# Patient Record
Sex: Male | Born: 1965
Health system: Southern US, Community
[De-identification: ages and names within clinical notes are randomized; demographics above are authoritative.]

## PROBLEM LIST (undated history)

## (undated) DIAGNOSIS — C801 Malignant (primary) neoplasm, unspecified: Secondary | ICD-10-CM

## (undated) DIAGNOSIS — J449 Chronic obstructive pulmonary disease, unspecified: Secondary | ICD-10-CM

## (undated) DIAGNOSIS — I219 Acute myocardial infarction, unspecified: Secondary | ICD-10-CM

## (undated) DIAGNOSIS — I10 Essential (primary) hypertension: Secondary | ICD-10-CM

## (undated) DIAGNOSIS — E785 Hyperlipidemia, unspecified: Secondary | ICD-10-CM

## (undated) HISTORY — PX: OTHER SURGICAL HISTORY: SHX169

## (undated) HISTORY — DX: Chronic obstructive pulmonary disease, unspecified: J44.9

## (undated) HISTORY — DX: Hyperlipidemia, unspecified: E78.5

---
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
## 2002-10-29 ENCOUNTER — Emergency Department (HOSPITAL_COMMUNITY): Admission: EM | Admit: 2002-10-29 | Discharge: 2002-10-29 | Payer: Self-pay | Admitting: Emergency Medicine

## 2006-07-16 ENCOUNTER — Encounter: Payer: Self-pay | Admitting: Emergency Medicine

## 2006-07-17 ENCOUNTER — Inpatient Hospital Stay (HOSPITAL_COMMUNITY): Admission: EM | Admit: 2006-07-17 | Discharge: 2006-07-17 | Payer: Self-pay | Admitting: Emergency Medicine
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
## 2011-05-17 ENCOUNTER — Other Ambulatory Visit: Payer: Self-pay | Admitting: Internal Medicine

## 2011-05-17 DIAGNOSIS — C3490 Malignant neoplasm of unspecified part of unspecified bronchus or lung: Secondary | ICD-10-CM

## 2011-10-04 ENCOUNTER — Other Ambulatory Visit: Payer: Self-pay | Admitting: Physician Assistant

## 2012-05-07 ENCOUNTER — Encounter: Payer: Self-pay | Admitting: Specialist

## 2012-05-07 NOTE — Progress Notes (Signed)
Met on Monday, March 10, with [facility] as he talked about the latest scan results he received.  I listened, offered support.

## 2012-07-10 ENCOUNTER — Telehealth: Payer: Self-pay | Admitting: Radiation Oncology

## 2012-07-10 NOTE — Telephone Encounter (Signed)
Opened in error

## 2013-04-07 ENCOUNTER — Other Ambulatory Visit: Payer: Self-pay | Admitting: Radiation Therapy

## 2019-04-27 ENCOUNTER — Emergency Department (HOSPITAL_COMMUNITY): Payer: Self-pay

## 2019-04-27 ENCOUNTER — Other Ambulatory Visit: Payer: Self-pay

## 2019-04-27 ENCOUNTER — Emergency Department (HOSPITAL_COMMUNITY)
Admission: EM | Admit: 2019-04-27 | Discharge: 2019-04-27 | Discharge status: Against Medical Advice | Payer: Self-pay | Attending: Emergency Medicine | Admitting: Emergency Medicine

## 2019-04-27 ENCOUNTER — Encounter (HOSPITAL_COMMUNITY): Payer: Self-pay | Admitting: Emergency Medicine

## 2019-04-27 DIAGNOSIS — F17210 Nicotine dependence, cigarettes, uncomplicated: Secondary | ICD-10-CM | POA: Insufficient documentation

## 2019-04-27 DIAGNOSIS — I214 Non-ST elevation (NSTEMI) myocardial infarction: Secondary | ICD-10-CM | POA: Insufficient documentation

## 2019-04-27 LAB — CBC WITH DIFFERENTIAL/PLATELET
Abs Immature Granulocytes: 0.02 10*3/uL (ref 0.00–0.07)
Basophils Absolute: 0.1 10*3/uL (ref 0.0–0.1)
Basophils Relative: 1 %
Eosinophils Absolute: 0.1 10*3/uL (ref 0.0–0.5)
Eosinophils Relative: 2 %
HCT: 41 % (ref 39.0–52.0)
Hemoglobin: 13.8 g/dL (ref 13.0–17.0)
Immature Granulocytes: 0 %
Lymphocytes Relative: 36 %
Lymphs Abs: 1.9 10*3/uL (ref 0.7–4.0)
MCH: 33.9 pg (ref 26.0–34.0)
MCHC: 33.7 g/dL (ref 30.0–36.0)
MCV: 100.7 fL — ABNORMAL HIGH (ref 80.0–100.0)
Monocytes Absolute: 0.5 10*3/uL (ref 0.1–1.0)
Monocytes Relative: 9 %
Neutro Abs: 2.8 10*3/uL (ref 1.7–7.7)
Neutrophils Relative %: 52 %
Platelets: 217 10*3/uL (ref 150–400)
RBC: 4.07 MIL/uL — ABNORMAL LOW (ref 4.22–5.81)
RDW: 12.7 % (ref 11.5–15.5)
WBC: 5.4 10*3/uL (ref 4.0–10.5)
nRBC: 0 % (ref 0.0–0.2)

## 2019-04-27 LAB — COMPREHENSIVE METABOLIC PANEL
ALT: 170 U/L — ABNORMAL HIGH (ref 0–44)
AST: 127 U/L — ABNORMAL HIGH (ref 15–41)
Albumin: 3.8 g/dL (ref 3.5–5.0)
Alkaline Phosphatase: 62 U/L (ref 38–126)
Anion gap: 15 (ref 5–15)
BUN: 11 mg/dL (ref 6–20)
CO2: 19 mmol/L — ABNORMAL LOW (ref 22–32)
Calcium: 8.6 mg/dL — ABNORMAL LOW (ref 8.9–10.3)
Chloride: 102 mmol/L (ref 98–111)
Creatinine, Ser: 0.86 mg/dL (ref 0.61–1.24)
GFR calc Af Amer: >60 mL/min (ref >60)
GFR calc non Af Amer: >60 mL/min (ref >60)
Glucose, Bld: 83 mg/dL (ref 70–99)
Potassium: 4.1 mmol/L (ref 3.5–5.1)
Sodium: 136 mmol/L (ref 135–145)
Total Bilirubin: 0.9 mg/dL (ref 0.3–1.2)
Total Protein: 8 g/dL (ref 6.5–8.1)

## 2019-04-27 LAB — TROPONIN I (HIGH SENSITIVITY): Troponin I (High Sensitivity): 76 ng/L — ABNORMAL HIGH (ref <18)

## 2019-04-27 LAB — RAPID URINE DRUG SCREEN, HOSP PERFORMED
Amphetamines: NOT DETECTED
Barbiturates: NOT DETECTED
Benzodiazepines: NOT DETECTED
Cocaine: POSITIVE — AB
Opiates: NOT DETECTED
Tetrahydrocannabinol: POSITIVE — AB

## 2019-04-27 LAB — D-DIMER, QUANTITATIVE: D-Dimer, Quant: >20 ug/mL-FEU — ABNORMAL HIGH (ref 0.00–0.50)

## 2019-04-27 LAB — ETHANOL: Alcohol, Ethyl (B): 222 mg/dL — ABNORMAL HIGH (ref <10)

## 2019-04-27 MED ORDER — ASPIRIN 81 MG PO CHEW
324.0000 mg | CHEWABLE_TABLET | Freq: Once | ORAL | Status: AC
  Administered 2019-04-27: 324 mg via ORAL
  Filled 2019-04-27: qty 4

## 2019-04-27 MED ORDER — NITROGLYCERIN IN D5W 200-5 MCG/ML-% IV SOLN
5.0000 ug/min | INTRAVENOUS | Status: DC
  Administered 2019-04-27: 5 ug/min via INTRAVENOUS
  Filled 2019-04-27: qty 250

## 2019-04-27 MED ORDER — SODIUM CHLORIDE 0.9 % IV SOLN: INTRAVENOUS | Status: DC

## 2019-04-27 NOTE — Discharge Instructions (Signed)
You and I have discussed your results and I have informed you that you are having a heart attack.  This could be from a blockage in your heart but could also be related to a blood clot.  Either way this could end with you dying.  You have refused to stay to continue treatment in the emergency department for life saving care.  At any time should you change your mind you may return to the emergency department immediately and we will take care of you.  Be aware that by going home you are putting your life at risk and you may die from this illness

## 2019-04-27 NOTE — ED Provider Notes (Signed)
[facility] EMERGENCY DEPARTMENT Provider Note   CSN: [id number] Arrival date & time: 04/27/19  2043     History Chief Complaint  Patient presents with  . Chest Pain    Luis Gilbert is a 54 y.o. male.  HPI   This patient is a 54 year old male, he presents to the hospital with a complaint of chest pain which is just left of his sternum.  It has been going on intermittently for the last week since his father died and he was at the funeral.  He reports that multiple family members have cardiac disease including his father, the patient himself states that he takes no medications, he does not go to the doctor, he drinks about 80 ounces of beer per day and considers himself a heavy drinker.  He has been drinking this week, he denies drug use, he does endorse smoking and has smoked over 1 pack of cigarettes a day for over 35 years.  He reports that the pain in his chest seems to radiate to his shoulder and down his arm to the elbow.  He becomes short of breath with even small amounts of exertion and states walking across the room causes him to become short of breath.  He denies swelling of the legs and denies fevers chills nausea vomiting or diarrhea.  The symptoms have been persistent and gradually worsening prompting his visit to the emergency department tonight.  I have reviewed the patient's medical history, I see no prior visits to the emergency department since 2008.  There is a history of a possible malignant neoplasm of the lungs from oncology visits in 2013.     History reviewed. No pertinent past medical history.  There are no problems to display for this patient.   History reviewed. No pertinent surgical history.     History reviewed. No pertinent family history.  Social History   Tobacco Use  . Smoking status: Current Every Day Smoker    Packs/day: 1.00    Types: Cigarettes  . Smokeless tobacco: Never Used  Substance Use Topics  . Alcohol use: Yes    Comment: 2 40s  per week  . Drug use: Yes    Frequency: 7.0 times per week    Types: Marijuana    Comment: daily    Home Medications Prior to Admission medications   Not on File    Allergies    Patient has no known allergies.  Review of Systems   Review of Systems  All other systems reviewed and are negative.   Physical Exam Updated Vital Signs BP 118/85   Pulse (!) 112   Temp 98.4 F (36.9 C)   Resp (!) 23   Ht 1.829 m (6')   Wt 88.5 kg   SpO2 98%   BMI 26.45 kg/m   Physical Exam Vitals and nursing note reviewed.  Constitutional:      General: He is not in acute distress.    Appearance: He is well-developed.  HENT:     Head: Normocephalic and atraumatic.     Mouth/Throat:     Pharynx: No oropharyngeal exudate.  Eyes:     General: No scleral icterus.       Right eye: No discharge.        Left eye: No discharge.     Conjunctiva/sclera: Conjunctivae normal.     Pupils: Pupils are equal, round, and reactive to light.  Neck:     Thyroid: No thyromegaly.     Vascular: No JVD.  Cardiovascular:     Rate and Rhythm: Regular rhythm. Tachycardia present.     Heart sounds: Normal heart sounds. No murmur. No friction rub. No gallop.   Pulmonary:     Effort: Pulmonary effort is normal. No respiratory distress.     Breath sounds: Normal breath sounds. No wheezing or rales.  Abdominal:     General: Bowel sounds are normal. There is no distension.     Palpations: Abdomen is soft. There is no mass.     Tenderness: There is no abdominal tenderness.  Musculoskeletal:        General: No tenderness. Normal range of motion.     Cervical back: Normal range of motion and neck supple.  Lymphadenopathy:     Cervical: No cervical adenopathy.  Skin:    General: Skin is warm and dry.     Findings: No erythema or rash.  Neurological:     Mental Status: He is alert.     Coordination: Coordination normal.  Psychiatric:        Behavior: Behavior normal.     ED Results / Procedures /  Treatments   Labs (all labs ordered are listed, but only abnormal results are displayed) Labs Reviewed  CBC WITH DIFFERENTIAL/PLATELET - Abnormal; Notable for the following components:      Result Value   RBC 4.07 (*)    MCV 100.7 (*)    All other components within normal limits  COMPREHENSIVE METABOLIC PANEL - Abnormal; Notable for the following components:   CO2 19 (*)    Calcium 8.6 (*)    AST 127 (*)    ALT 170 (*)    All other components within normal limits  ETHANOL - Abnormal; Notable for the following components:   Alcohol, Ethyl (B) 222 (*)    All other components within normal limits  TROPONIN I (HIGH SENSITIVITY) - Abnormal; Notable for the following components:   Troponin I (High Sensitivity) 76 (*)    All other components within normal limits  RAPID URINE DRUG SCREEN, HOSP PERFORMED  D-DIMER, QUANTITATIVE (NOT AT [facility])  TROPONIN I (HIGH SENSITIVITY)    EKG EKG Interpretation  Date/Time:  Sunday April 27 2019 21:04:44 EST Ventricular Rate:  110 PR Interval:    QRS Duration: 86 QT Interval:  334 QTC Calculation: 452 R Axis:   -36 Text Interpretation: Sinus tachycardia Left axis deviation Abnormal T, consider ischemia, diffuse leads No old tracing to compare Confirmed by Noemi Chapel [phone]) on 04/27/2019 9:10:11 PM   Radiology DG Chest Port 1 View  Result Date: 04/27/2019 CLINICAL DATA:  Chest pain EXAM: PORTABLE CHEST 1 VIEW COMPARISON:  Jul 17, 2006 FINDINGS: The heart size and mediastinal contours are borderline enlarged. There is prominence of the central pulmonary vasculature. No large airspace consolidation or pleural effusion. Right posterior healed rib fractures are seen. IMPRESSION: Prominence of the central pulmonary vasculature. Electronically Signed   By: Prudencio Pair M.D.   On: 04/27/2019 22:02    Procedures .Critical Care Performed by: Noemi Chapel, MD Authorized by: Noemi Chapel, MD   Critical care provider statement:    Critical care  time (minutes):  35   Critical care time was exclusive of:  Separately billable procedures and treating other patients and teaching time   Critical care was necessary to treat or prevent imminent or life-threatening deterioration of the following conditions:  Cardiac failure   Critical care was time spent personally by me on the following activities:  Blood draw for specimens, development  of treatment plan with patient or surrogate, discussions with consultants, evaluation of patient's response to treatment, examination of patient, obtaining history from patient or surrogate, ordering and performing treatments and interventions, ordering and review of laboratory studies, ordering and review of radiographic studies, pulse oximetry, re-evaluation of patient's condition and review of old charts   (including critical care time)  Medications Ordered in ED Medications  nitroGLYCERIN 50 mg in dextrose 5 % 250 mL (0.2 mg/mL) infusion (0 mcg/min Intravenous Stopped 04/27/19 2238)  0.9 %  sodium chloride infusion ( Intravenous Stopped 04/27/19 2238)  aspirin chewable tablet 324 mg (324 mg Oral Given 04/27/19 2151)    ED Course  I have reviewed the triage vital signs and the nursing notes.  Pertinent labs & imaging results that were available during my care of the patient were reviewed by me and considered in my medical decision making (see chart for details).    MDM Rules/Calculators/A&P                      The patient is tachycardic to 110 bpm.  The EKG is abnormal with left axis deviation and ischemic findings with ST abnormalities and T wave inversions specifically in the inferior leads II, III and aVF.  Additionally there are some lateral precordial abnormalities in V4 V5 and V6 with T wave abnormalities.  Evidently this patient has some type of history of cancer thus pulmonary embolism should be considered, given his shortness of breath and chest pain this is a reasonable consideration.  Labs  pending, will add a D-dimer as well as a troponin, aspirin will be given  I have reviewed this patient's labs, he has an elevated troponin, a chest x-ray that shows prominence of his pulmonary vessels and an EKG which is ischemic.  This could be pulmonary embolism, this could also be related to a heart attack, I have discussed these findings with the patient at the bedside and he absolutely refuses to stay in the hospital.  Despite his alcohol level of 222 he is wide awake alert and able to very clearly tell me that he understands that if he leaves he may very well die.  He will not tell me the reason that he wants to leave.  He agrees to sign out [facility].  He continues to be mildly tachycardic, he has medical decision-making capacity and states that he will come back if he changes his mind or if his symptoms worsen.  He is aware that this is a life-threatening condition that could end with the loss of his life.  He is very clearly able to articulate to me the consequences of leaving [place].  I spent approximately 10 to 15 minutes trying to counsel the patient to stay, he refuses  Final Clinical Impression(s) / ED Diagnoses Final diagnoses:  NSTEMI (non-ST elevated myocardial infarction) [facility])    Rx / [facility] Orders ED Discharge Orders    None       Noemi Chapel, MD 04/27/19 2241

## 2019-04-27 NOTE — ED Triage Notes (Signed)
Pt c/o of chest pain x1 week. Pain began after his father died. Has not taken anything for the pain. No relieving or aggravating factors

## 2019-04-28 ENCOUNTER — Encounter (HOSPITAL_COMMUNITY): Payer: Self-pay

## 2019-04-28 ENCOUNTER — Other Ambulatory Visit: Payer: Self-pay

## 2019-04-28 ENCOUNTER — Emergency Department (HOSPITAL_COMMUNITY): Payer: Self-pay

## 2019-04-28 ENCOUNTER — Inpatient Hospital Stay (HOSPITAL_COMMUNITY)
Admission: EM | Admit: 2019-04-28 | Discharge: 2019-05-02 | DRG: 176 | Disposition: A | Discharge status: Home / Self Care | Payer: Self-pay | Attending: Internal Medicine | Admitting: Internal Medicine

## 2019-04-28 DIAGNOSIS — K760 Fatty (change of) liver, not elsewhere classified: Secondary | ICD-10-CM | POA: Diagnosis present

## 2019-04-28 DIAGNOSIS — F1910 Other psychoactive substance abuse, uncomplicated: Secondary | ICD-10-CM | POA: Diagnosis present

## 2019-04-28 DIAGNOSIS — Y906 Blood alcohol level of 120-199 mg/100 ml: Secondary | ICD-10-CM | POA: Diagnosis present

## 2019-04-28 DIAGNOSIS — I2694 Multiple subsegmental pulmonary emboli without acute cor pulmonale: Secondary | ICD-10-CM

## 2019-04-28 DIAGNOSIS — F10129 Alcohol abuse with intoxication, unspecified: Secondary | ICD-10-CM | POA: Diagnosis present

## 2019-04-28 DIAGNOSIS — I82442 Acute embolism and thrombosis of left tibial vein: Secondary | ICD-10-CM | POA: Diagnosis present

## 2019-04-28 DIAGNOSIS — K769 Liver disease, unspecified: Secondary | ICD-10-CM | POA: Diagnosis present

## 2019-04-28 DIAGNOSIS — I248 Other forms of acute ischemic heart disease: Secondary | ICD-10-CM | POA: Diagnosis present

## 2019-04-28 DIAGNOSIS — K7010 Alcoholic hepatitis without ascites: Secondary | ICD-10-CM | POA: Diagnosis present

## 2019-04-28 DIAGNOSIS — R000 Tachycardia, unspecified: Secondary | ICD-10-CM | POA: Diagnosis present

## 2019-04-28 DIAGNOSIS — R7989 Other specified abnormal findings of blood chemistry: Secondary | ICD-10-CM | POA: Diagnosis present

## 2019-04-28 DIAGNOSIS — I82462 Acute embolism and thrombosis of left calf muscular vein: Secondary | ICD-10-CM | POA: Diagnosis present

## 2019-04-28 DIAGNOSIS — F17210 Nicotine dependence, cigarettes, uncomplicated: Secondary | ICD-10-CM | POA: Diagnosis present

## 2019-04-28 DIAGNOSIS — E876 Hypokalemia: Secondary | ICD-10-CM | POA: Diagnosis present

## 2019-04-28 DIAGNOSIS — I82402 Acute embolism and thrombosis of unspecified deep veins of left lower extremity: Secondary | ICD-10-CM | POA: Diagnosis present

## 2019-04-28 DIAGNOSIS — I82452 Acute embolism and thrombosis of left peroneal vein: Secondary | ICD-10-CM | POA: Diagnosis present

## 2019-04-28 DIAGNOSIS — I5030 Unspecified diastolic (congestive) heart failure: Secondary | ICD-10-CM | POA: Diagnosis present

## 2019-04-28 DIAGNOSIS — R778 Other specified abnormalities of plasma proteins: Secondary | ICD-10-CM | POA: Diagnosis present

## 2019-04-28 DIAGNOSIS — R748 Abnormal levels of other serum enzymes: Secondary | ICD-10-CM

## 2019-04-28 DIAGNOSIS — I82432 Acute embolism and thrombosis of left popliteal vein: Secondary | ICD-10-CM | POA: Diagnosis present

## 2019-04-28 DIAGNOSIS — Z20822 Contact with and (suspected) exposure to covid-19: Secondary | ICD-10-CM | POA: Diagnosis present

## 2019-04-28 DIAGNOSIS — I2699 Other pulmonary embolism without acute cor pulmonale: Principal | ICD-10-CM | POA: Diagnosis present

## 2019-04-28 DIAGNOSIS — F10120 Alcohol abuse with intoxication, uncomplicated: Secondary | ICD-10-CM | POA: Diagnosis present

## 2019-04-28 DIAGNOSIS — F10920 Alcohol use, unspecified with intoxication, uncomplicated: Secondary | ICD-10-CM | POA: Diagnosis present

## 2019-04-28 HISTORY — DX: Other pulmonary embolism without acute cor pulmonale: I26.99

## 2019-04-28 LAB — COMPREHENSIVE METABOLIC PANEL
ALT: 179 U/L — ABNORMAL HIGH (ref 0–44)
AST: 115 U/L — ABNORMAL HIGH (ref 15–41)
Albumin: 3.7 g/dL (ref 3.5–5.0)
Alkaline Phosphatase: 64 U/L (ref 38–126)
Anion gap: 16 — ABNORMAL HIGH (ref 5–15)
BUN: 12 mg/dL (ref 6–20)
CO2: 17 mmol/L — ABNORMAL LOW (ref 22–32)
Calcium: 8.4 mg/dL — ABNORMAL LOW (ref 8.9–10.3)
Chloride: 99 mmol/L (ref 98–111)
Creatinine, Ser: 0.94 mg/dL (ref 0.61–1.24)
GFR calc Af Amer: >60 mL/min (ref >60)
GFR calc non Af Amer: >60 mL/min (ref >60)
Glucose, Bld: 79 mg/dL (ref 70–99)
Potassium: 4 mmol/L (ref 3.5–5.1)
Sodium: 132 mmol/L — ABNORMAL LOW (ref 135–145)
Total Bilirubin: 0.8 mg/dL (ref 0.3–1.2)
Total Protein: 8 g/dL (ref 6.5–8.1)

## 2019-04-28 LAB — CBC WITH DIFFERENTIAL/PLATELET
Abs Immature Granulocytes: 0.02 10*3/uL (ref 0.00–0.07)
Basophils Absolute: 0.1 10*3/uL (ref 0.0–0.1)
Basophils Relative: 1 %
Eosinophils Absolute: 0 10*3/uL (ref 0.0–0.5)
Eosinophils Relative: 0 %
HCT: 40.2 % (ref 39.0–52.0)
Hemoglobin: 13.7 g/dL (ref 13.0–17.0)
Immature Granulocytes: 0 %
Lymphocytes Relative: 36 %
Lymphs Abs: 2 10*3/uL (ref 0.7–4.0)
MCH: 34 pg (ref 26.0–34.0)
MCHC: 34.1 g/dL (ref 30.0–36.0)
MCV: 99.8 fL (ref 80.0–100.0)
Monocytes Absolute: 0.5 10*3/uL (ref 0.1–1.0)
Monocytes Relative: 10 %
Neutro Abs: 2.9 10*3/uL (ref 1.7–7.7)
Neutrophils Relative %: 53 %
Platelets: 246 10*3/uL (ref 150–400)
RBC: 4.03 MIL/uL — ABNORMAL LOW (ref 4.22–5.81)
RDW: 12.5 % (ref 11.5–15.5)
WBC: 5.5 10*3/uL (ref 4.0–10.5)
nRBC: 0 % (ref 0.0–0.2)

## 2019-04-28 LAB — TROPONIN I (HIGH SENSITIVITY)
Troponin I (High Sensitivity): 74 ng/L — ABNORMAL HIGH (ref <18)
Troponin I (High Sensitivity): 84 ng/L — ABNORMAL HIGH (ref <18)

## 2019-04-28 LAB — ETHANOL: Alcohol, Ethyl (B): 164 mg/dL — ABNORMAL HIGH (ref <10)

## 2019-04-28 LAB — PROTIME-INR
INR: 1 (ref 0.8–1.2)
Prothrombin Time: 13.5 seconds (ref 11.4–15.2)

## 2019-04-28 LAB — APTT: aPTT: 28 seconds (ref 24–36)

## 2019-04-28 LAB — POC SARS CORONAVIRUS 2 AG -  ED: SARS Coronavirus 2 Ag: NEGATIVE

## 2019-04-28 MED ORDER — TRAMADOL HCL 50 MG PO TABS
50.0000 mg | ORAL_TABLET | Freq: Three times a day (TID) | ORAL | Status: DC | PRN
  Administered 2019-04-29 – 2019-05-01 (×5): 50 mg via ORAL
  Filled 2019-04-28 (×6): qty 1

## 2019-04-28 MED ORDER — ACETAMINOPHEN 325 MG PO TABS
650.0000 mg | ORAL_TABLET | Freq: Four times a day (QID) | ORAL | Status: DC | PRN
  Administered 2019-04-30 – 2019-05-02 (×2): 650 mg via ORAL
  Filled 2019-04-28 (×2): qty 2

## 2019-04-28 MED ORDER — ONDANSETRON HCL 4 MG PO TABS: 4.0000 mg | ORAL_TABLET | Freq: Four times a day (QID) | ORAL | Status: DC | PRN

## 2019-04-28 MED ORDER — IOHEXOL 350 MG/ML SOLN
100.0000 mL | Freq: Once | INTRAVENOUS | Status: AC | PRN
  Administered 2019-04-28: 100 mL via INTRAVENOUS

## 2019-04-28 MED ORDER — SODIUM CHLORIDE 0.9 % IV SOLN: 250.0000 mL | INTRAVENOUS | Status: DC | PRN

## 2019-04-28 MED ORDER — ONDANSETRON HCL 4 MG/2ML IJ SOLN: 4.0000 mg | Freq: Four times a day (QID) | INTRAMUSCULAR | Status: DC | PRN

## 2019-04-28 MED ORDER — ADULT MULTIVITAMIN W/MINERALS CH
1.0000 | ORAL_TABLET | Freq: Every day | ORAL | Status: DC
  Administered 2019-04-28 – 2019-05-02 (×5): 1 via ORAL
  Filled 2019-04-28 (×5): qty 1

## 2019-04-28 MED ORDER — HEPARIN BOLUS VIA INFUSION
6000.0000 [IU] | Freq: Once | INTRAVENOUS | Status: AC
  Administered 2019-04-28: 6000 [IU] via INTRAVENOUS

## 2019-04-28 MED ORDER — SODIUM CHLORIDE 0.9% FLUSH: 3.0000 mL | INTRAVENOUS | Status: DC | PRN

## 2019-04-28 MED ORDER — LACTATED RINGERS IV SOLN: INTRAVENOUS | Status: DC

## 2019-04-28 MED ORDER — SODIUM CHLORIDE 0.9 % IV BOLUS
500.0000 mL | Freq: Once | INTRAVENOUS | Status: AC
  Administered 2019-04-28: 500 mL via INTRAVENOUS

## 2019-04-28 MED ORDER — POLYETHYLENE GLYCOL 3350 17 G PO PACK: 17.0000 g | PACK | Freq: Every day | ORAL | Status: DC | PRN

## 2019-04-28 MED ORDER — FOLIC ACID 1 MG PO TABS
1.0000 mg | ORAL_TABLET | Freq: Every day | ORAL | Status: DC
  Administered 2019-04-28 – 2019-05-02 (×5): 1 mg via ORAL
  Filled 2019-04-28 (×5): qty 1

## 2019-04-28 MED ORDER — ACETAMINOPHEN 650 MG RE SUPP: 650.0000 mg | Freq: Four times a day (QID) | RECTAL | Status: DC | PRN

## 2019-04-28 MED ORDER — SODIUM CHLORIDE 0.9% FLUSH
3.0000 mL | Freq: Two times a day (BID) | INTRAVENOUS | Status: DC
  Administered 2019-04-29 – 2019-05-01 (×3): 3 mL via INTRAVENOUS

## 2019-04-28 MED ORDER — THIAMINE HCL 100 MG PO TABS
100.0000 mg | ORAL_TABLET | Freq: Every day | ORAL | Status: DC
  Administered 2019-04-28 – 2019-05-02 (×5): 100 mg via ORAL
  Filled 2019-04-28 (×5): qty 1

## 2019-04-28 MED ORDER — NICOTINE 21 MG/24HR TD PT24
21.0000 mg | MEDICATED_PATCH | Freq: Every day | TRANSDERMAL | Status: DC
  Administered 2019-04-28 – 2019-05-02 (×4): 21 mg via TRANSDERMAL
  Filled 2019-04-28 (×5): qty 1

## 2019-04-28 MED ORDER — HEPARIN (PORCINE) 25000 UT/250ML-% IV SOLN
1500.0000 [IU]/kg/h | INTRAVENOUS | Status: DC
  Administered 2019-04-28: 1500 [IU]/kg/h via INTRAVENOUS
  Filled 2019-04-28 (×2): qty 250

## 2019-04-28 MED ORDER — BISACODYL 10 MG RE SUPP: 10.0000 mg | Freq: Every day | RECTAL | Status: DC | PRN

## 2019-04-28 NOTE — Progress Notes (Signed)
ANTICOAGULATION CONSULT NOTE - Initial Consult  Pharmacy Consult for heparin Indication: pulmonary embolus  No Known Allergies  Patient Measurements: Height: 6' (182.9 cm) Weight: 190 lb (86.2 kg) IBW/kg (Calculated) : 77.6 Heparin Dosing Weight: 86 kg  Vital Signs: Temp: 97.5 F (36.4 C) (03/01 1832) BP: 117/88 (03/01 2100) Pulse Rate: 109 (03/01 2100)  Labs: Recent Labs    04/27/19 2130 04/28/19 1857  HGB 13.8 13.7  HCT 41.0 40.2  PLT 217 246  CREATININE 0.86 0.94  TROPONINIHS 76* 74*    Estimated Creatinine Clearance: 99.8 mL/min (by C-G formula based on SCr of 0.94 mg/dL).   Medical History: Past Medical History:  Diagnosis Date  . Cancer ([place])     Medications:  (Not in a hospital admission)  Scheduled:  . heparin  6,000 Units Intravenous Once   Infusions:  . heparin      Assessment: Pt was seen in the ED yesterday before leaving AMA. He presented again with bilateral PE. IV heparin has been ordered per Dr Vanita Panda.   Goal of Therapy:  Heparin level 0.3-0.7 units/ml Monitor platelets by anticoagulation protocol: Yes   Plan:  Heparin 6000 units bolus x1 Heparin infusion 1500 units/hr F/u with AM HL and daily  Onnie Boer, PharmD, [place], AAHIVP, CPP Infectious Disease Pharmacist 04/28/2019 9:12 PM

## 2019-04-28 NOTE — ED Provider Notes (Signed)
[facility] EMERGENCY DEPARTMENT Provider Note   CSN: [id number] Arrival date & time: 04/28/19  1824     History Chief Complaint  Patient presents with  . Chest Pain    Luis Gilbert is a 54 y.o. male.  HPI    Patient presents less than 24 hours after leaving [place] due to ongoing chest pain. Exact onset is unclear, but it seems though the patient may have had pain for about 1 week, but worse over the past 2 days.  He notes that the day with persistency of the pain which is sternal, tight, pressure-like he requests evaluation. He states that he has no fever, no nausea, vomiting.  He does, however of some left calf pain, that seems to be gone over the past weeks as well. Patient has history of cardiac disease, but cannot specify what exactly has happened in the past.  He acknowledges leaving yesterday, but states that he is now interested in having a full evaluation.  It is unclear if he is taking any medication for relief, has not received any medication by EMS providers to assist with the HPI. I evaluated the patient as he was brought to the room via EMS transport, reviewed his rhythm strips with them. History reviewed. No pertinent past medical history.  There are no problems to display for this patient.   History reviewed. No pertinent surgical history.     No family history on file.  Social History   Tobacco Use  . Smoking status: Current Every Day Smoker    Packs/day: 1.00    Types: Cigarettes  . Smokeless tobacco: Never Used  Substance Use Topics  . Alcohol use: Yes    Comment: 2 40s per week  . Drug use: Yes    Frequency: 7.0 times per week    Types: Marijuana    Comment: daily    Home Medications Prior to Admission medications   Not on File    Allergies    Patient has no known allergies.  Review of Systems   Review of Systems  Constitutional:       Per HPI, otherwise negative  HENT:       Per HPI, otherwise negative  Respiratory:        Per HPI, otherwise negative  Cardiovascular:       Per HPI, otherwise negative  Gastrointestinal: Negative for vomiting.  Endocrine:       Negative aside from HPI  Genitourinary:       Neg aside from HPI   Musculoskeletal:       Per HPI, otherwise negative  Skin: Negative.   Neurological: Negative for syncope.    Physical Exam Updated Vital Signs BP (!) 122/96 (BP Location: Right Arm)   Pulse (!) 115   Temp (!) 97.5 F (36.4 C)   Resp 19   Ht 6' (1.829 m)   Wt 86.2 kg   SpO2 93%   BMI 25.77 kg/m   Physical Exam Vitals and nursing note reviewed.  Constitutional:      Appearance: He is ill-appearing.     Comments: Uncomfortable appearing adult male awake and alert  HENT:     Head: Normocephalic and atraumatic.  Eyes:     Conjunctiva/sclera: Conjunctivae normal.  Cardiovascular:     Rate and Rhythm: Regular rhythm. Tachycardia present.  Pulmonary:     Effort: Pulmonary effort is normal. No respiratory distress.     Breath sounds: No stridor.  Abdominal:     General:  There is no distension.  Musculoskeletal:     Comments: No gross size difference in the lower extremities, the patient notes that his left calf hurts more, seems larger.  He moves both legs distally appropriately.  Skin:    General: Skin is warm and dry.  Neurological:     Mental Status: He is alert and oriented to person, place, and time.     ED Results / Procedures / Treatments   Labs (all labs ordered are listed, but only abnormal results are displayed) Labs Reviewed  COMPREHENSIVE METABOLIC PANEL - Abnormal; Notable for the following components:      Result Value   Sodium 132 (*)    CO2 17 (*)    Calcium 8.4 (*)    AST 115 (*)    ALT 179 (*)    Anion gap 16 (*)    All other components within normal limits  ETHANOL - Abnormal; Notable for the following components:   Alcohol, Ethyl (B) 164 (*)    All other components within normal limits  CBC WITH DIFFERENTIAL/PLATELET - Abnormal;  Notable for the following components:   RBC 4.03 (*)    All other components within normal limits  TROPONIN I (HIGH SENSITIVITY) - Abnormal; Notable for the following components:   Troponin I (High Sensitivity) 74 (*)    All other components within normal limits  POC SARS CORONAVIRUS 2 AG -  ED  TROPONIN I (HIGH SENSITIVITY)    EMS rhythm strip with sinus tachycardia, T wave inversions, rate 109, abnormal rhythm strip.  Similarly, on initial cardiac monitor the patient has tachycardia, T wave inversions, rate 110, abnormal.  EKG EKG Interpretation  Date/Time:  Monday April 28 2019 18:33:43 EST Ventricular Rate:  111 PR Interval:    QRS Duration: 97 QT Interval:  351 QTC Calculation: 477 R Axis:   -15 Text Interpretation: Sinus tachycardia Consider right atrial enlargement Probable left ventricular hypertrophy Nonspecific T abnormalities, inferior leads Borderline prolonged QT interval Abnormal ECG Confirmed by Carmin Muskrat [phone]) on 04/28/2019 7:00:36 PM   Radiology CT Angio Chest PE W and/or Wo Contrast  Result Date: 04/28/2019 CLINICAL DATA:  Shortness of breath, chest pain x1 week, elevated D-dimer EXAM: CT ANGIOGRAPHY CHEST WITH CONTRAST TECHNIQUE: Multidetector CT imaging of the chest was performed using the standard protocol during bolus administration of intravenous contrast. Multiplanar CT image reconstructions and MIPs were obtained to evaluate the vascular anatomy. CONTRAST:  162mL OMNIPAQUE IOHEXOL 350 MG/ML SOLN COMPARISON:  Chest radiograph dated 04/27/2019 FINDINGS: Cardiovascular: Satisfactory opacification the bilateral pulmonary arteries to the segmental level. Extensive distal main, lobar, and segmental pulmonary emboli in all lobes. Overall clot burden is moderate to large. Elevated RV to LV ratio (1.46), raising concern for right heart strain. Mild enlargement of the main pulmonary artery, suggesting pulmonary arterial hypertension. Although not tailored for  evaluation of the thoracic aorta, there is no evidence of thoracic aortic aneurysm or dissection. Mild atherosclerotic calcifications of the aortic arch. The heart is normal in size.  No pericardial effusion. Mild coronary atherosclerosis the LAD. Mediastinum/Nodes: No suspicious mediastinal lymphadenopathy. Visualized thyroid is unremarkable. Lungs/Pleura: Lungs are clear. No suspicious pulmonary nodules. No focal consolidation or findings suspicious for developing pulmonary infarct. No pleural effusion or pneumothorax. Upper Abdomen: Visualized upper abdomen is notable for severe hepatic steatosis and a 2.8 cm lateral left upper pole renal cyst. Musculoskeletal: Mild degenerative changes of the visualized thoracolumbar spine. Review of the MIP images confirms the above findings. IMPRESSION: Extensive distal main,  lobar, and segmental pulmonary emboli in all lobes, as described above. Overall clot burden is moderate to large. Elevated RV to LV ratio, raising concern for right heart strain. Critical Value/emergent results were called by telephone at the time of interpretation on 04/28/2019 at 8:22 pm to provider Carmin Muskrat , who verbally acknowledged these results. Aortic Atherosclerosis (ICD10-I70.0). Electronically Signed   By: Julian Hy M.D.   On: 04/28/2019 20:27   DG Chest Port 1 View  Result Date: 04/27/2019 CLINICAL DATA:  Chest pain EXAM: PORTABLE CHEST 1 VIEW COMPARISON:  Jul 17, 2006 FINDINGS: The heart size and mediastinal contours are borderline enlarged. There is prominence of the central pulmonary vasculature. No large airspace consolidation or pleural effusion. Right posterior healed rib fractures are seen. IMPRESSION: Prominence of the central pulmonary vasculature. Electronically Signed   By: Prudencio Pair M.D.   On: 04/27/2019 22:02    Procedures Procedures (including critical care time)  CRITICAL CARE Performed by: Carmin Muskrat Total critical care time: 40  minutes Critical care time was exclusive of separately billable procedures and treating other patients. Critical care was necessary to treat or prevent imminent or life-threatening deterioration. Critical care was time spent personally by me on the following activities: development of treatment plan with patient and/or surrogate as well as nursing, discussions with consultants, evaluation of patient's response to treatment, examination of patient, obtaining history from patient or surrogate, ordering and performing treatments and interventions, ordering and review of laboratory studies, ordering and review of radiographic studies, pulse oximetry and re-evaluation of patient's condition.  Medications Ordered in ED Medications  heparin ADULT infusion 100 units/mL (25000 units/273mL sodium chloride 0.45%) (has no administration in time range)  sodium chloride 0.9 % bolus 500 mL (500 mLs Intravenous New Bag/Given 04/28/19 1855)  iohexol (OMNIPAQUE) 350 MG/ML injection 100 mL (100 mLs Intravenous Contrast Given 04/28/19 2006)    ED Course  I have reviewed the triage vital signs and the nursing notes.  Pertinent labs & imaging results that were available during my care of the patient were reviewed by me and considered in my medical decision making (see chart for details). Immediately after the initial evaluation reviewed the patient's chart and discussed his presentation with the clinician who took care of him yesterday prior to the patient leaving [place].  Labs from yesterday notable for elevated D-dimer, troponin.  Given his description of calf pain, chest pain, tachycardia, patient will have CT angiography in addition to labs.  8:53 PM Patient awake and alert, does not require oxygen, but CT findings reviewed, discussed with the radiologist due to bilateral numerous pulmonary embolism. Patient is aware of need for admission, and starting a heparin drip. This adult male presents 1 day  after leaving [place] following initial presentation for chest pain, and labs from initial visit were notable for elevated D-dimer, troponin, and in spite of substantial recommendation by the clinician at that point, the patient left, only to return today with ongoing pain.  Patient is found to have pulmonary embolism bilaterally, requiring initiation of heparin, admission for further monitoring, management.  Final Clinical Impression(s) / ED Diagnoses Final diagnoses:  Bilateral pulmonary embolism ([place])     Carmin Muskrat, MD 04/28/19 2055

## 2019-04-28 NOTE — ED Notes (Signed)
Hospitalist at bedside 

## 2019-04-28 NOTE — ED Notes (Signed)
[facility] called for transport at this time. 

## 2019-04-28 NOTE — H&P (Signed)
History and Physical    Patient Demographics:    Luis Gilbert YQM:[id number] DOB: 1965-08-25 DOA: 04/28/2019  PCP: Patient, No Pcp Per  Patient coming from: Home  I have personally briefly reviewed patient's old medical records in [place]  Chief Complaint: Chest pain   Assessment & Plan:     Assessment/Plan Principal Problem:   Pulmonary embolism ([place]) Active Problems:   Alcoholic intoxication without complication (HCC)   Elevated troponin     Principal Problem: Bilateral extensive pulmonary embolism Patient found to have extensive bilateral pulmonary limits him with evidence of right heart strain.  He is being admitted to stepdown unit at [facility]. -We will continue heparin infusion -Echocardiogram, bilateral lower extremity venous Doppler -Oxygen as needed -Pulmonary Consult in AM as needed  Other Active Problems: Elevated troponin Patient noted to have mild elevation in troponin at 74.  Appears to be stable.  Does have mild EKG changes with T wave inversions and mild ST depressions in inferior leads.  Appears to be secondary to findings of extensive pulmonary embolism. -Telemetry monitoring -Serial troponins  Polysubstance abuse Patient has history of daily alcohol use with consumption of 80 ounces of beer a week.  Elevated alcohol at 164 on presentation.  Also had positive cocaine and marijuana on urine drug screen from yesterday. -We will place on multivitamin, thiamine, folic acid -Gentle IV fluid resuscitation -monitor for signs of withdrawal  Nicotine dependence Patient smokes 1 pack/day. -We will place on nicotine patch  DVT prophylaxis: Heparin Code Status:  Full code Family Communication: N/A  Disposition Plan: Transfer to [facility] stepdown unit for extensive bilateral pulmonary embolism, expect at least 3-day inpatient stay. Consults called: N/A Admission status: Inpatient status    HPI:     HPI: Luis Gilbert is a 54 y.o. male with  medical history significant of alcohol abuse, substance abuse who presented to the ER with ongoing left-sided chest pain. Patient initially presented to the ER with complaints of left-sided chest pain on 04/27/2019.  He had been found to have a mildly elevated troponin and was recommended admission but he decided to sign out [place].  He returned back again today with ongoing chest pain.  Patient states he has been having intermittent left-sided chest pain ongoing for the past week since his father died and he was at a funeral.  He also has mild associated shortness of breath. Prior history of substance abuse, alcohol abuse and active smoking.  He smokes 1 pack/day.  He also drinks about 80 ounces of alcohol a week. No nausea, vomiting, abdominal pain, diarrhea, dysuria No dizziness, lightheadedness, syncope, seizures, focal deficits, visual disturbances ED Course:  Vital Signs reviewed on presentation, significant for temperature 97.5, heart rate 111, blood pressure 122/96, saturation 93% on room air. Labs reviewed, significant for sodium 132, potassium 4.0, BUN 12, creatinine 0.9, AST 115, ALT 179, rest of the LFTs within normal limits, troponin 74, WBC count 5.5, hemoglobin 13.7, hematocrit 40, platelets 246, alcohol level 164, D-dimer more than 20, urine drug screen from yesterday positive for cocaine, marijuana. Imaging personally Reviewed, CT angiogram of the chest shows extensive distal main, lobar and segmental pulmonary emboli in all lobes.  Overall clot burden is moderate large.  Elevated RV to LV ratio raising concerns for right heart strain.  Mild enlargement of the main pulmonary artery suggesting pulmonary arterial hypertension. EKG personally reviewed, shows sinus tachycardia, T wave inversions in 3, aVF, mild ST depressions in inferior leads.  Review of systems:    Review of Systems: As per HPI otherwise 10 point review of systems negative.  All other review of systems  is negative except the ones noted above in the HPI.    Past Medical and Surgical History:  Reviewed by me  Past Medical History:  Diagnosis Date  . Cancer [facility])     History reviewed. No pertinent surgical history.   Social History:  Reviewed by me   reports that he has been smoking cigarettes. He has been smoking about 1.00 pack per day. He has never used smokeless tobacco. He reports current alcohol use. He reports current drug use. Frequency: 7.00 times per week. Drug: Marijuana.  Allergies:    No Known Allergies  Family History :   No family history on file. Family history reviewed, noted as above, not pertinent to current presentation.   Home Medications:    Prior to Admission medications   Not on File    Physical Exam:    Physical Exam: Vitals:   04/28/19 1832 04/28/19 1930 04/28/19 2000 04/28/19 2030  BP: (!) 122/96 (!) 118/92 136/88 (!) 122/96  Pulse: (!) 115 95 (!) 111 (!) 111  Resp: 19 16 20 18   Temp: (!) 97.5 F (36.4 C)     SpO2: 93% 94% 94% 93%  Weight:      Height:        Constitutional: NAD, calm, comfortable Vitals:   04/28/19 1832 04/28/19 1930 04/28/19 2000 04/28/19 2030  BP: (!) 122/96 (!) 118/92 136/88 (!) 122/96  Pulse: (!) 115 95 (!) 111 (!) 111  Resp: 19 16 20 18   Temp: (!) 97.5 F (36.4 C)     SpO2: 93% 94% 94% 93%  Weight:      Height:       Eyes: PERRL, lids and conjunctivae normal ENMT: Mucous membranes are moist. Posterior pharynx clear of any exudate or lesions.Normal dentition.  Neck: normal, supple, no masses, no thyromegaly Respiratory: clear to auscultation bilaterally, no wheezing, no crackles. Normal respiratory effort. No accessory muscle use.  Cardiovascular: Tachycardia noted, no murmurs / rubs / gallops. No extremity edema. 2+ pedal pulses. No carotid bruits.  Abdomen: no tenderness, no masses palpated. No hepatosplenomegaly. Bowel sounds positive.  Musculoskeletal: no clubbing / cyanosis. No joint deformity upper  and lower extremities. Good ROM, no contractures. Normal muscle tone.  Skin: no rashes, lesions, ulcers. No induration Neurologic: CN 2-12 grossly intact. Sensation intact, DTR normal. Strength 5/5 in all 4.  Psychiatric: Normal judgment and insight. Alert and oriented x 3. Normal mood.    Decubitus Ulcers: Not present on admission Catheters and tubes: None  Data Review:    Labs on Admission: I have personally reviewed following labs and imaging studies  CBC: Recent Labs  Lab 04/27/19 2130 04/28/19 1857  WBC 5.4 5.5  NEUTROABS 2.8 2.9  HGB 13.8 13.7  HCT 41.0 40.2  MCV 100.7* 99.8  PLT 217 009   Basic Metabolic Panel: Recent Labs  Lab 04/27/19 2130 04/28/19 1857  NA 136 132*  K 4.1 4.0  CL 102 99  CO2 19* 17*  GLUCOSE 83 79  BUN 11 12  CREATININE 0.86 0.94  CALCIUM 8.6* 8.4*   GFR: Estimated Creatinine Clearance: 99.8 mL/min (by C-G formula based on SCr of 0.94 mg/dL). Liver Function Tests: Recent Labs  Lab 04/27/19 2130 04/28/19 1857  AST 127* 115*  ALT 170* 179*  ALKPHOS 62 64  BILITOT 0.9 0.8  PROT 8.0 8.0  ALBUMIN  3.8 3.7   No results for input(s): LIPASE, AMYLASE in the last 168 hours. No results for input(s): AMMONIA in the last 168 hours. Coagulation Profile: No results for input(s): INR, PROTIME in the last 168 hours. Cardiac Enzymes: No results for input(s): CKTOTAL, CKMB, CKMBINDEX, TROPONINI in the last 168 hours. BNP (last 3 results) No results for input(s): PROBNP in the last 8760 hours. HbA1C: No results for input(s): HGBA1C in the last 72 hours. CBG: No results for input(s): GLUCAP in the last 168 hours. Lipid Profile: No results for input(s): CHOL, HDL, LDLCALC, TRIG, CHOLHDL, LDLDIRECT in the last 72 hours. Thyroid Function Tests: No results for input(s): TSH, T4TOTAL, FREET4, T3FREE, THYROIDAB in the last 72 hours. Anemia Panel: No results for input(s): VITAMINB12, FOLATE, FERRITIN, TIBC, IRON, RETICCTPCT in the last 72  hours. Urine analysis: No results found for: COLORURINE, APPEARANCEUR, LABSPEC, PHURINE, GLUCOSEU, HGBUR, BILIRUBINUR, KETONESUR, PROTEINUR, UROBILINOGEN, NITRITE, LEUKOCYTESUR   Imaging Results:      Radiological Exams on Admission: CT Angio Chest PE W and/or Wo Contrast  Result Date: 04/28/2019 CLINICAL DATA:  Shortness of breath, chest pain x1 week, elevated D-dimer EXAM: CT ANGIOGRAPHY CHEST WITH CONTRAST TECHNIQUE: Multidetector CT imaging of the chest was performed using the standard protocol during bolus administration of intravenous contrast. Multiplanar CT image reconstructions and MIPs were obtained to evaluate the vascular anatomy. CONTRAST:  162mL OMNIPAQUE IOHEXOL 350 MG/ML SOLN COMPARISON:  Chest radiograph dated 04/27/2019 FINDINGS: Cardiovascular: Satisfactory opacification the bilateral pulmonary arteries to the segmental level. Extensive distal main, lobar, and segmental pulmonary emboli in all lobes. Overall clot burden is moderate to large. Elevated RV to LV ratio (1.46), raising concern for right heart strain. Mild enlargement of the main pulmonary artery, suggesting pulmonary arterial hypertension. Although not tailored for evaluation of the thoracic aorta, there is no evidence of thoracic aortic aneurysm or dissection. Mild atherosclerotic calcifications of the aortic arch. The heart is normal in size.  No pericardial effusion. Mild coronary atherosclerosis the LAD. Mediastinum/Nodes: No suspicious mediastinal lymphadenopathy. Visualized thyroid is unremarkable. Lungs/Pleura: Lungs are clear. No suspicious pulmonary nodules. No focal consolidation or findings suspicious for developing pulmonary infarct. No pleural effusion or pneumothorax. Upper Abdomen: Visualized upper abdomen is notable for severe hepatic steatosis and a 2.8 cm lateral left upper pole renal cyst. Musculoskeletal: Mild degenerative changes of the visualized thoracolumbar spine. Review of the MIP images confirms the  above findings. IMPRESSION: Extensive distal main, lobar, and segmental pulmonary emboli in all lobes, as described above. Overall clot burden is moderate to large. Elevated RV to LV ratio, raising concern for right heart strain. Critical Value/emergent results were called by telephone at the time of interpretation on 04/28/2019 at 8:22 pm to provider Carmin Muskrat , who verbally acknowledged these results. Aortic Atherosclerosis (ICD10-I70.0). Electronically Signed   By: Julian Hy M.D.   On: 04/28/2019 20:27   DG Chest Port 1 View  Result Date: 04/27/2019 CLINICAL DATA:  Chest pain EXAM: PORTABLE CHEST 1 VIEW COMPARISON:  Jul 17, 2006 FINDINGS: The heart size and mediastinal contours are borderline enlarged. There is prominence of the central pulmonary vasculature. No large airspace consolidation or pleural effusion. Right posterior healed rib fractures are seen. IMPRESSION: Prominence of the central pulmonary vasculature. Electronically Signed   By: Prudencio Pair M.D.   On: 04/27/2019 22:02      [facility] Ginette Otto MD [facility]  If 7PM-7AM, please contact night-coverage   04/28/2019, 8:49 PM

## 2019-04-28 NOTE — ED Triage Notes (Signed)
Pt presents to ED with complaints of chest pain x 1 week, was brought to ED last night left AMA. Pt had 4 ASA and nitro. Pt c/o left sided chest pain, sob

## 2019-04-29 ENCOUNTER — Inpatient Hospital Stay (HOSPITAL_COMMUNITY): Payer: Self-pay

## 2019-04-29 ENCOUNTER — Encounter (HOSPITAL_COMMUNITY): Payer: Self-pay | Admitting: Internal Medicine

## 2019-04-29 DIAGNOSIS — I2609 Other pulmonary embolism with acute cor pulmonale: Secondary | ICD-10-CM

## 2019-04-29 DIAGNOSIS — R778 Other specified abnormalities of plasma proteins: Secondary | ICD-10-CM

## 2019-04-29 DIAGNOSIS — I2699 Other pulmonary embolism without acute cor pulmonale: Principal | ICD-10-CM

## 2019-04-29 DIAGNOSIS — F10920 Alcohol use, unspecified with intoxication, uncomplicated: Secondary | ICD-10-CM

## 2019-04-29 LAB — CBC
HCT: 41.4 % (ref 39.0–52.0)
Hemoglobin: 14.2 g/dL (ref 13.0–17.0)
MCH: 33.8 pg (ref 26.0–34.0)
MCHC: 34.3 g/dL (ref 30.0–36.0)
MCV: 98.6 fL (ref 80.0–100.0)
Platelets: 245 10*3/uL (ref 150–400)
RBC: 4.2 MIL/uL — ABNORMAL LOW (ref 4.22–5.81)
RDW: 12.4 % (ref 11.5–15.5)
WBC: 5.2 10*3/uL (ref 4.0–10.5)
nRBC: 0 % (ref 0.0–0.2)

## 2019-04-29 LAB — ECHOCARDIOGRAM COMPLETE
Height: 72 in
Weight: 3188.73 oz

## 2019-04-29 LAB — HEPARIN LEVEL (UNFRACTIONATED)
Heparin Unfractionated: 0.25 IU/mL — ABNORMAL LOW (ref 0.30–0.70)
Heparin Unfractionated: 0.44 IU/mL (ref 0.30–0.70)

## 2019-04-29 LAB — SARS CORONAVIRUS 2 (TAT 6-24 HRS): SARS Coronavirus 2: NEGATIVE

## 2019-04-29 LAB — HIV ANTIBODY (ROUTINE TESTING W REFLEX): HIV Screen 4th Generation wRfx: NONREACTIVE

## 2019-04-29 MED ORDER — HEPARIN (PORCINE) 25000 UT/250ML-% IV SOLN
1650.0000 [IU]/h | INTRAVENOUS | Status: AC
  Administered 2019-04-30 – 2019-05-01 (×2): 1650 [IU]/h via INTRAVENOUS
  Filled 2019-04-29 (×3): qty 250

## 2019-04-29 MED ORDER — METOPROLOL TARTRATE 5 MG/5ML IV SOLN
5.0000 mg | INTRAVENOUS | Status: AC | PRN
  Administered 2019-04-29 (×2): 5 mg via INTRAVENOUS
  Filled 2019-04-29 (×3): qty 5

## 2019-04-29 MED ORDER — HYDRALAZINE HCL 10 MG PO TABS: 10.0000 mg | ORAL_TABLET | Freq: Three times a day (TID) | ORAL | Status: DC | PRN

## 2019-04-29 NOTE — Progress Notes (Signed)
Pt's BP is elevated; 145/109, 160/117, 143/107.  MD paged.  Will await new orders and continue to monitor.

## 2019-04-29 NOTE — Progress Notes (Signed)
Abdominal US ordered for pt. RN explained to pt that he must be NPO until 8:30 pm for this test.  Pt voiced understanding.  Will continue to monitor.

## 2019-04-29 NOTE — Progress Notes (Signed)
ANTICOAGULATION CONSULT NOTE  Pharmacy Consult for heparin Indication: pulmonary embolus  No Known Allergies  Patient Measurements: Height: 6' (182.9 cm) Weight: 199 lb 4.7 oz (90.4 kg) IBW/kg (Calculated) : 77.6 Heparin Dosing Weight: 86 kg  Vital Signs: Temp: 98.6 F (37 C) (03/02 0750) Temp Source: Oral (03/02 0750) BP: 137/93 (03/02 0750) Pulse Rate: 96 (03/02 0750)  Labs: Recent Labs    04/27/19 2130 04/27/19 2130 04/28/19 1857 04/28/19 2019 04/29/19 0720  HGB 13.8   < > 13.7  --  14.2  HCT 41.0  --  40.2  --  41.4  PLT 217  --  246  --  245  APTT  --   --  28  --   --   LABPROT  --   --  13.5  --   --   INR  --   --  1.0  --   --   HEPARINUNFRC  --   --   --   --  0.25*  CREATININE 0.86  --  0.94  --   --   TROPONINIHS 76*  --  74* 84*  --    < > = values in this interval not displayed.    Estimated Creatinine Clearance: 99.8 mL/min (by C-G formula based on SCr of 0.94 mg/dL).   Medical History: Past Medical History:  Diagnosis Date  . Cancer ([place])     Medications:  No medications prior to admission.   Scheduled:  . folic acid  1 mg Oral Daily  . multivitamin with minerals  1 tablet Oral Daily  . nicotine  21 mg Transdermal Daily  . sodium chloride flush  3 mL Intravenous Q12H  . thiamine  100 mg Oral Daily   Infusions:  . sodium chloride    . heparin 1,500 Units/kg/hr (04/28/19 2120)  . lactated ringers 75 mL/hr at 04/28/19 2223    Assessment: Pt was seen in the ED yesterday before leaving AMA. He presented again with bilateral PE. IV heparin has been ordered per Dr Vanita Panda.   Initial heparin level slightly subtherapeutic at 0.25 on 1500 units/hr  Goal of Therapy:  Heparin level 0.3-0.7 units/ml Monitor platelets by anticoagulation protocol: Yes   Plan:  Increase heparin gtt to 1650 units/hr F/u 6 hour heparin level  Bertis Ruddy, PharmD Clinical Pharmacist Please check AMION for all [place] numbers 04/29/2019 8:48  AM

## 2019-04-29 NOTE — Progress Notes (Addendum)
PROGRESS NOTE    Luis Gilbert  AGT:[id number] DOB: 16-Dec-1965 DOA: 04/28/2019 PCP: Patient, No Pcp Per  Brief Narrative:   54 year old gentleman prior history of alcohol abuse, substance abuse presents with left-sided chest pain on 04/27/2019.  He signed out [place] before being admitted for further evaluation.  He presents again yesterday for similar complaints and was found to have extensive pulmonary emboli in all the lobes.  He was started on IV heparin and [facility].  Patient is currently on room air with oxygen sats greater than 95% and reports his chest pain is improving.  Assessment & Plan:   Principal Problem:   Pulmonary embolism (HCC) Active Problems:   Alcoholic intoxication without complication (HCC)   Elevated troponin   Pulmonary emboli (HCC)   Bilateral extensive pulmonary emboli; With evidence of right heart strain. Patient was started on IV heparin. Get benefits check on NOAC and transition to oral Eliquis on discharge.  Echocardiogram ordered showed left ventricular ejection fraction of 55 to 60%, no regional wall abnormalities, left ventricular diastolic parameters are consistent with grade 1 diastolic dysfunction.  Right ventricular free wall is mildly hypokinetic, apex contracts normally, consistent with acute PE right ventricular systolic function is mildly reduced. Patient reports occasional left-sided chest wall pain.  He is currently on room air and his blood pressure parameters have well controlled.  Recommend to continue IV heparin for 48 to 72 hours and get venous duplex of the lower extremities to check for clot burden. If patient is found to have acute DVT in lower extremities, increasing clot burden recommended PCCM evaluation.    Elevated liver enzymes Probably from alcohol hepatitis. Ultrasound liver ordered for further evaluation.    Mild elevated troponins Probably from right heart strain from bilateral PE Continue to  monitor.    Alcohol intoxication/substance abuse Patient is currently on CIWA, watch for withdrawal symptoms.   DVT prophylaxis: Heparin Code Status: Full code Family Communication: None at bedside Disposition Plan:  Patient is from home, still on IV heparin require another 24 to 48 hours of IV heparin and further work-up with venous duplex of the lower extremities prior to discharge home   Consultants:   None  Procedures: CT angiogram of the chest Echocardiogram Antimicrobials: None  Subjective: Patient is not hypoxic is alert and does not appear to be in distress, occasional left-sided chest wall pain on deep breathing.  No shortness of breath, nausea, vomiting or abdominal pain  Objective: Vitals:   04/29/19 1144 04/29/19 1152 04/29/19 1155 04/29/19 1546  BP: (!) 145/109 (!) 160/117 (!) 143/107 (!) 157/97  Pulse: 96   (!) 108  Resp: 12  16 16   Temp: 97.6 F (36.4 C)   98.4 F (36.9 C)  TempSrc: Oral   Oral  SpO2: 95%   93%  Weight:      Height:        Intake/Output Summary (Last 24 hours) at 04/29/2019 1551 Last data filed at 04/29/2019 1514 Gross per 24 hour  Intake 2123.73 ml  Output 450 ml  Net 1673.73 ml   Filed Weights   04/28/19 1831 04/28/19 2351 04/29/19 0330  Weight: 86.2 kg 90.4 kg 90.4 kg    Examination:  General exam: Appears calm and comfortable room air Respiratory system: Clear to auscultation. Respiratory effort normal. Cardiovascular system: S1 & S2 heard, tachycardic, no pedal edema Gastrointestinal system: Abdomen is nondistended, soft and nontender.  Normal bowel sounds heard. Central nervous system: Alert and oriented. No focal  neurological deficits. Extremities: Symmetric 5 x 5 power. Skin: No rashes, lesions or ulcers Psychiatry:  Mood & affect appropriate.     Data Reviewed: I have personally reviewed following labs and imaging studies  CBC: Recent Labs  Lab 04/27/19 2130 04/28/19 1857 04/29/19 0720  WBC 5.4 5.5 5.2   NEUTROABS 2.8 2.9  --   HGB 13.8 13.7 14.2  HCT 41.0 40.2 41.4  MCV 100.7* 99.8 98.6  PLT 217 246 161   Basic Metabolic Panel: Recent Labs  Lab 04/27/19 2130 04/28/19 1857  NA 136 132*  K 4.1 4.0  CL 102 99  CO2 19* 17*  GLUCOSE 83 79  BUN 11 12  CREATININE 0.86 0.94  CALCIUM 8.6* 8.4*   GFR: Estimated Creatinine Clearance: 99.8 mL/min (by C-G formula based on SCr of 0.94 mg/dL). Liver Function Tests: Recent Labs  Lab 04/27/19 2130 04/28/19 1857  AST 127* 115*  ALT 170* 179*  ALKPHOS 62 64  BILITOT 0.9 0.8  PROT 8.0 8.0  ALBUMIN 3.8 3.7   No results for input(s): LIPASE, AMYLASE in the last 168 hours. No results for input(s): AMMONIA in the last 168 hours. Coagulation Profile: Recent Labs  Lab 04/28/19 1857  INR 1.0   Cardiac Enzymes: No results for input(s): CKTOTAL, CKMB, CKMBINDEX, TROPONINI in the last 168 hours. BNP (last 3 results) No results for input(s): PROBNP in the last 8760 hours. HbA1C: No results for input(s): HGBA1C in the last 72 hours. CBG: No results for input(s): GLUCAP in the last 168 hours. Lipid Profile: No results for input(s): CHOL, HDL, LDLCALC, TRIG, CHOLHDL, LDLDIRECT in the last 72 hours. Thyroid Function Tests: No results for input(s): TSH, T4TOTAL, FREET4, T3FREE, THYROIDAB in the last 72 hours. Anemia Panel: No results for input(s): VITAMINB12, FOLATE, FERRITIN, TIBC, IRON, RETICCTPCT in the last 72 hours. Sepsis Labs: No results for input(s): PROCALCITON, LATICACIDVEN in the last 168 hours.  Recent Results (from the past 240 hour(s))  SARS CORONAVIRUS 2 (TAT 6-24 HRS) Nasopharyngeal Nasopharyngeal Swab     Status: None   Collection Time: 04/28/19 10:38 PM   Specimen: Nasopharyngeal Swab  Result Value Ref Range Status   SARS Coronavirus 2 NEGATIVE NEGATIVE Final    Comment: (NOTE) SARS-CoV-2 target nucleic acids are NOT DETECTED. The SARS-CoV-2 RNA is generally detectable in upper and lower respiratory specimens  during the acute phase of infection. Negative results do not preclude SARS-CoV-2 infection, do not rule out co-infections with other pathogens, and should not be used as the sole basis for treatment or other patient management decisions. Negative results must be combined with clinical observations, patient history, and epidemiological information. The expected result is Negative. Fact Sheet for Patients: SugarRoll.be Fact Sheet for Healthcare Providers: https://www.woods-mathews.com/ This test is not yet approved or cleared by the [place] FDA and  has been authorized for detection and/or diagnosis of SARS-CoV-2 by FDA under an Emergency Use Authorization (EUA). This EUA will remain  in effect (meaning this test can be used) for the duration of the COVID-19 declaration under Section 56 4(b)(1) of the Act, 21 U.S.C. section 360bbb-3(b)(1), unless the authorization is terminated or revoked sooner. Performed at [facility], [place] [address], [place], [place] [address]          Radiology Studies: CT Angio Chest PE W and/or Wo Contrast  Result Date: 04/28/2019 CLINICAL DATA:  Shortness of breath, chest pain x1 week, elevated D-dimer EXAM: CT ANGIOGRAPHY CHEST WITH CONTRAST TECHNIQUE: Multidetector CT imaging of the  chest was performed using the standard protocol during bolus administration of intravenous contrast. Multiplanar CT image reconstructions and MIPs were obtained to evaluate the vascular anatomy. CONTRAST:  166mL OMNIPAQUE IOHEXOL 350 MG/ML SOLN COMPARISON:  Chest radiograph dated 04/27/2019 FINDINGS: Cardiovascular: Satisfactory opacification the bilateral pulmonary arteries to the segmental level. Extensive distal main, lobar, and segmental pulmonary emboli in all lobes. Overall clot burden is moderate to large. Elevated RV to LV ratio (1.46), raising concern for right heart strain. Mild enlargement of the main pulmonary artery,  suggesting pulmonary arterial hypertension. Although not tailored for evaluation of the thoracic aorta, there is no evidence of thoracic aortic aneurysm or dissection. Mild atherosclerotic calcifications of the aortic arch. The heart is normal in size.  No pericardial effusion. Mild coronary atherosclerosis the LAD. Mediastinum/Nodes: No suspicious mediastinal lymphadenopathy. Visualized thyroid is unremarkable. Lungs/Pleura: Lungs are clear. No suspicious pulmonary nodules. No focal consolidation or findings suspicious for developing pulmonary infarct. No pleural effusion or pneumothorax. Upper Abdomen: Visualized upper abdomen is notable for severe hepatic steatosis and a 2.8 cm lateral left upper pole renal cyst. Musculoskeletal: Mild degenerative changes of the visualized thoracolumbar spine. Review of the MIP images confirms the above findings. IMPRESSION: Extensive distal main, lobar, and segmental pulmonary emboli in all lobes, as described above. Overall clot burden is moderate to large. Elevated RV to LV ratio, raising concern for right heart strain. Critical Value/emergent results were called by telephone at the time of interpretation on 04/28/2019 at 8:22 pm to provider Carmin Muskrat , who verbally acknowledged these results. Aortic Atherosclerosis (ICD10-I70.0). Electronically Signed   By: Julian Hy M.D.   On: 04/28/2019 20:27   DG Chest Port 1 View  Result Date: 04/27/2019 CLINICAL DATA:  Chest pain EXAM: PORTABLE CHEST 1 VIEW COMPARISON:  Jul 17, 2006 FINDINGS: The heart size and mediastinal contours are borderline enlarged. There is prominence of the central pulmonary vasculature. No large airspace consolidation or pleural effusion. Right posterior healed rib fractures are seen. IMPRESSION: Prominence of the central pulmonary vasculature. Electronically Signed   By: Prudencio Pair M.D.   On: 04/27/2019 22:02   ECHOCARDIOGRAM COMPLETE  Result Date: 04/29/2019    ECHOCARDIOGRAM REPORT    Patient Name:   Luis Gilbert Date of Exam: 04/29/2019 Medical Rec #:  [id number]     Height:       72.0 in Accession #:    [id number]    Weight:       199.3 lb Date of Birth:  10/10/1965     BSA:          2.127 m Patient Age:    13 years      BP:           140/96 mmHg Patient Gender: M             HR:           91 bpm. Exam Location:  Inpatient Procedure: 2D Echo, Cardiac Doppler and Color Doppler Indications:    Pulmonary embolus  History:        Patient has no prior history of Echocardiogram examinations.                 Arrythmias:abnormal EKG, Signs/Symptoms:Chest Pain; Risk                 Factors:Current Smoker. Pulmonary embolus, elevated Troponin,                 polysubstance abuse,.  Sonographer:    Jerene Pitch  St[place] Referring Phys: [id number] [facility] M GADHIA  Sonographer Comments: Image acquisition challenging due to COPD. IMPRESSIONS  1. Left ventricular ejection fraction, by estimation, is 55 to 60%. The left ventricle has normal function. The left ventricle has no regional wall motion abnormalities. There is mild concentric left ventricular hypertrophy. Left ventricular diastolic parameters are consistent with Grade I diastolic dysfunction (impaired relaxation).  2. The right ventricular free wall is mildly hypokinetic, but the apex contracts normally (McConnell's sign), consistent with acute pulmonary embolism. Right ventricular systolic function is mildly reduced. The right ventricular size is mildly enlarged.  There is mildly elevated pulmonary artery systolic pressure. The estimated right ventricular systolic pressure is 56.4 mmHg.  3. Right atrial size was mildly dilated.  4. The mitral valve is normal in structure and function. No evidence of mitral valve regurgitation.  5. The aortic valve is normal in structure and function. Aortic valve regurgitation is not visualized.  6. The inferior vena cava is normal in size with greater than 50% respiratory variability, suggesting right atrial pressure of 3  mmHg. Comparison(s): No prior Echocardiogram. FINDINGS  Left Ventricle: Left ventricular ejection fraction, by estimation, is 55 to 60%. The left ventricle has normal function. The left ventricle has no regional wall motion abnormalities. The left ventricular internal cavity size was normal in size. There is  mild concentric left ventricular hypertrophy. Left ventricular diastolic parameters are consistent with Grade I diastolic dysfunction (impaired relaxation). Normal left ventricular filling pressure. Right Ventricle: The right ventricular free wall is mildly hypokinetic, but the apex contracts normally (McConnell's sign), consistent with acute pulmonary embolism. The right ventricular size is mildly enlarged. Right vetricular wall thickness was not assessed. Right ventricular systolic function is mildly reduced. There is mildly elevated pulmonary artery systolic pressure. The tricuspid regurgitant velocity is 2.65 m/s, and with an assumed right atrial pressure of 8 mmHg, the estimated right ventricular systolic pressure is 33.2 mmHg. Left Atrium: Left atrial size was normal in size. Right Atrium: Right atrial size was mildly dilated. Pericardium: There is no evidence of pericardial effusion. Mitral Valve: The mitral valve is normal in structure and function. No evidence of mitral valve regurgitation. Tricuspid Valve: The tricuspid valve is grossly normal. Tricuspid valve regurgitation is not demonstrated. Aortic Valve: The aortic valve is normal in structure and function. Aortic valve regurgitation is not visualized. Pulmonic Valve: The pulmonic valve was not well visualized. Pulmonic valve regurgitation is not visualized. Aorta: The aortic root is normal in size and structure. Venous: The inferior vena cava is normal in size with greater than 50% respiratory variability, suggesting right atrial pressure of 3 mmHg. IAS/Shunts: No atrial level shunt detected by color flow Doppler.  LEFT VENTRICLE PLAX 2D LVIDd:          5.00 cm  Diastology LVIDs:         3.70 cm  LV e' lateral:   6.53 cm/s LV PW:         1.30 cm  LV E/e' lateral: 7.2 LV IVS:        1.20 cm  LV e' medial:    4.79 cm/s LVOT diam:     2.20 cm  LV E/e' medial:  9.8 LV SV:         37 LV SV Index:   17 LVOT Area:     3.80 cm  RIGHT VENTRICLE RV Basal diam:  3.00 cm RV S prime:     7.07 cm/s TAPSE (M-mode): 3.2 cm LEFT ATRIUM  Index       RIGHT ATRIUM           Index LA diam:        3.10 cm 1.46 cm/m  RA Area:     16.60 cm LA Vol (A2C):   43.5 ml 20.45 ml/m RA Volume:   44.10 ml  20.73 ml/m LA Vol (A4C):   56.1 ml 26.37 ml/m LA Biplane Vol: 50.4 ml 23.69 ml/m  AORTIC VALVE LVOT Vmax:   65.90 cm/s LVOT Vmean:  45.600 cm/s LVOT VTI:    0.097 m  AORTA Ao Root diam: 3.30 cm MITRAL VALVE               TRICUSPID VALVE MV Area (PHT): 12.04 cm   TR Peak grad:   28.1 mmHg MV Decel Time: 63 msec     TR Vmax:        265.00 cm/s MV E velocity: 47.10 cm/s MV A velocity: 73.70 cm/s  SHUNTS MV E/A ratio:  0.64        Systemic VTI:  0.10 m                            Systemic Diam: 2.20 cm Dani Gobble Croitoru MD Electronically signed by Sanda Klein MD Signature Date/Time: 04/29/2019/11:14:13 AM    Final         Scheduled Meds: . folic acid  1 mg Oral Daily  . multivitamin with minerals  1 tablet Oral Daily  . nicotine  21 mg Transdermal Daily  . sodium chloride flush  3 mL Intravenous Q12H  . thiamine  100 mg Oral Daily   Continuous Infusions: . sodium chloride    . heparin 1,650 Units/hr (04/29/19 0851)  . lactated ringers 75 mL/hr at 04/29/19 0849     LOS: 1 day        Hosie Poisson, MD [facility]   To contact the attending provider between 7A-7P or the covering provider during after hours 7P-7A, please log into the web site [email or web address] and access using universal [place] password for that web site. If you do not have the password, please call the hospital operator.  04/29/2019, 3:51 PM

## 2019-04-29 NOTE — Progress Notes (Signed)
[place] for heparin Indication: pulmonary embolus  No Known Allergies  Patient Measurements: Height: 6' (182.9 cm) Weight: 199 lb 4.7 oz (90.4 kg) IBW/kg (Calculated) : 77.6 Heparin Dosing Weight: 86 kg  Vital Signs: Temp: 97.6 F (36.4 C) (03/02 1144) Temp Source: Oral (03/02 1144) BP: 143/107 (03/02 1155) Pulse Rate: 96 (03/02 1144)  Labs: Recent Labs    04/27/19 2130 04/27/19 2130 04/28/19 1857 04/28/19 2019 04/29/19 0720 04/29/19 1457  HGB 13.8   < > 13.7  --  14.2  --   HCT 41.0  --  40.2  --  41.4  --   PLT 217  --  246  --  245  --   APTT  --   --  28  --   --   --   LABPROT  --   --  13.5  --   --   --   INR  --   --  1.0  --   --   --   HEPARINUNFRC  --   --   --   --  0.25* 0.44  CREATININE 0.86  --  0.94  --   --   --   TROPONINIHS 76*  --  74* 84*  --   --    < > = values in this interval not displayed.    Estimated Creatinine Clearance: 99.8 mL/min (by C-G formula based on SCr of 0.94 mg/dL).   Medical History: Past Medical History:  Diagnosis Date  . Cancer ([place])   . PE (pulmonary thromboembolism) ([place]) 04/2019    Medications:  No medications prior to admission.   Scheduled:  . folic acid  1 mg Oral Daily  . multivitamin with minerals  1 tablet Oral Daily  . nicotine  21 mg Transdermal Daily  . sodium chloride flush  3 mL Intravenous Q12H  . thiamine  100 mg Oral Daily   Infusions:  . sodium chloride    . heparin 1,650 Units/hr (04/29/19 0851)  . lactated ringers 75 mL/hr at 04/29/19 3664    Assessment: Pt was seen in the ED yesterday before leaving AMA. He presented again with bilateral PE. Pharmacy dosing IV heparin  -heparin level at goal   Goal of Therapy:  Heparin level 0.3-0.7 units/ml Monitor platelets by anticoagulation protocol: Yes   Plan:  -No heparin changes needed -Daily heparin level and CBC  Hildred Laser, PharmD Clinical Pharmacist **Pharmacist phone directory can now  be found on [email or web address] ([email or web address]).  Listed under [place].

## 2019-04-29 NOTE — Progress Notes (Signed)
  Echocardiogram 2D Echocardiogram has been performed.  Luis Gilbert 04/29/2019, 9:25 AM

## 2019-04-30 ENCOUNTER — Inpatient Hospital Stay (HOSPITAL_COMMUNITY): Payer: Self-pay

## 2019-04-30 DIAGNOSIS — F10129 Alcohol abuse with intoxication, unspecified: Secondary | ICD-10-CM

## 2019-04-30 DIAGNOSIS — R748 Abnormal levels of other serum enzymes: Secondary | ICD-10-CM

## 2019-04-30 DIAGNOSIS — F1910 Other psychoactive substance abuse, uncomplicated: Secondary | ICD-10-CM

## 2019-04-30 DIAGNOSIS — F10929 Alcohol use, unspecified with intoxication, unspecified: Secondary | ICD-10-CM

## 2019-04-30 DIAGNOSIS — K760 Fatty (change of) liver, not elsewhere classified: Secondary | ICD-10-CM

## 2019-04-30 DIAGNOSIS — E876 Hypokalemia: Secondary | ICD-10-CM

## 2019-04-30 DIAGNOSIS — I2699 Other pulmonary embolism without acute cor pulmonale: Secondary | ICD-10-CM

## 2019-04-30 DIAGNOSIS — I5031 Acute diastolic (congestive) heart failure: Secondary | ICD-10-CM

## 2019-04-30 DIAGNOSIS — I82402 Acute embolism and thrombosis of unspecified deep veins of left lower extremity: Secondary | ICD-10-CM

## 2019-04-30 LAB — CBC
HCT: 38.5 % — ABNORMAL LOW (ref 39.0–52.0)
Hemoglobin: 13.3 g/dL (ref 13.0–17.0)
MCH: 33.8 pg (ref 26.0–34.0)
MCHC: 34.5 g/dL (ref 30.0–36.0)
MCV: 97.7 fL (ref 80.0–100.0)
Platelets: 240 10*3/uL (ref 150–400)
RBC: 3.94 MIL/uL — ABNORMAL LOW (ref 4.22–5.81)
RDW: 12.2 % (ref 11.5–15.5)
WBC: 5.8 10*3/uL (ref 4.0–10.5)
nRBC: 0 % (ref 0.0–0.2)

## 2019-04-30 LAB — COMPREHENSIVE METABOLIC PANEL
ALT: 101 U/L — ABNORMAL HIGH (ref 0–44)
AST: 53 U/L — ABNORMAL HIGH (ref 15–41)
Albumin: 3 g/dL — ABNORMAL LOW (ref 3.5–5.0)
Alkaline Phosphatase: 58 U/L (ref 38–126)
Anion gap: 13 (ref 5–15)
BUN: 13 mg/dL (ref 6–20)
CO2: 24 mmol/L (ref 22–32)
Calcium: 8.6 mg/dL — ABNORMAL LOW (ref 8.9–10.3)
Chloride: 99 mmol/L (ref 98–111)
Creatinine, Ser: 1.07 mg/dL (ref 0.61–1.24)
GFR calc Af Amer: >60 mL/min (ref >60)
GFR calc non Af Amer: >60 mL/min (ref >60)
Glucose, Bld: 142 mg/dL — ABNORMAL HIGH (ref 70–99)
Potassium: 3.3 mmol/L — ABNORMAL LOW (ref 3.5–5.1)
Sodium: 136 mmol/L (ref 135–145)
Total Bilirubin: 1.3 mg/dL — ABNORMAL HIGH (ref 0.3–1.2)
Total Protein: 6.8 g/dL (ref 6.5–8.1)

## 2019-04-30 LAB — RAPID URINE DRUG SCREEN, HOSP PERFORMED
Amphetamines: NOT DETECTED
Barbiturates: NOT DETECTED
Benzodiazepines: NOT DETECTED
Cocaine: NOT DETECTED
Opiates: NOT DETECTED
Tetrahydrocannabinol: POSITIVE — AB

## 2019-04-30 LAB — TROPONIN I (HIGH SENSITIVITY)
Troponin I (High Sensitivity): 23 ng/L — ABNORMAL HIGH (ref <18)
Troponin I (High Sensitivity): 21 ng/L — ABNORMAL HIGH (ref <18)

## 2019-04-30 LAB — MAGNESIUM: Magnesium: 1.8 mg/dL (ref 1.7–2.4)

## 2019-04-30 LAB — PHOSPHORUS: Phosphorus: 3.6 mg/dL (ref 2.5–4.6)

## 2019-04-30 LAB — HEPARIN LEVEL (UNFRACTIONATED): Heparin Unfractionated: 0.44 IU/mL (ref 0.30–0.70)

## 2019-04-30 MED ORDER — POTASSIUM CHLORIDE CRYS ER 20 MEQ PO TBCR
60.0000 meq | EXTENDED_RELEASE_TABLET | Freq: Once | ORAL | Status: AC
  Administered 2019-04-30: 60 meq via ORAL
  Filled 2019-04-30: qty 3

## 2019-04-30 NOTE — Progress Notes (Signed)
Patient reported itching all day yesterday and last night-no reports of rash. Itching has resolved.  No rash noted this am.

## 2019-04-30 NOTE — Progress Notes (Signed)
ANTICOAGULATION CONSULT NOTE - Follow Up Consult  Pharmacy Consult for Heparin Indication: pulmonary embolus  No Known Allergies  Patient Measurements: Height: 6' (182.9 cm) Weight: 199 lb 4.7 oz (90.4 kg) IBW/kg (Calculated) : 77.6 Heparin Dosing Weight: 86 kg  Vital Signs: Temp: 98 F (36.7 C) (03/03 0725) Temp Source: Oral (03/03 0725) BP: 136/94 (03/03 0935) Pulse Rate: 106 (03/03 0935)  Labs: Recent Labs    04/27/19 2130 04/27/19 2130 04/28/19 1857 04/28/19 1857 04/28/19 2019 04/29/19 0720 04/29/19 1457 04/30/19 0259 04/30/19 0916  HGB 13.8   < > 13.7   < >  --  14.2  --  13.3  --   HCT 41.0   < > 40.2  --   --  41.4  --  38.5*  --   PLT 217   < > 246  --   --  245  --  240  --   APTT  --   --  28  --   --   --   --   --   --   LABPROT  --   --  13.5  --   --   --   --   --   --   INR  --   --  1.0  --   --   --   --   --   --   HEPARINUNFRC  --   --   --   --   --  0.25* 0.44  --  0.44  CREATININE 0.86  --  0.94  --   --   --   --   --  1.07  TROPONINIHS 76*  --  74*  --  84*  --   --   --   --    < > = values in this interval not displayed.    Estimated Creatinine Clearance: 87.6 mL/min (by C-G formula based on SCr of 1.07 mg/dL).  Assessment:  54 yr old male seen in the ED 04/27/19 before leaving AMA. He presented again on 04/28/19 with bilateral PE. Pharmacy dosing IV heparin.   Heparin level remains therapeutic (0.44) on 1650 units/hr.  CBC stable. No bleeding reported. Heparin begun at 2120 on 3/1, planning 48-72 hrs before changing to oral anticoagulant.    Venous duplex pending.   Goal of Therapy:  Heparin level 0.3-0.7 units/ml Monitor platelets by anticoagulation protocol: Yes   Plan:   Continue heparin drip at 1650 units/hr  Daily heparin level and CBC while on heparin.  Follow up venous duplex, plans for oral anticoagulation.  Arty Baumgartner, [place] Phone: [phone] 04/30/2019,11:00 AM

## 2019-04-30 NOTE — TOC Initial Note (Signed)
Transition of Care [facility]) - Initial/Assessment Note    Patient Details  Name: Luis Gilbert MRN: [id number] Date of Birth: [birth year]  Transition of Care [facility] CM/SW Contact:    Curlene Labrum, RN Phone Number: 04/30/2019, 4:54 PM  Clinical Narrative:         54 year old gentleman prior history of alcohol abuse, substance abuse presents with left-sided chest pain on 04/27/2019.  He signed out [place] before being admitted for further evaluation.  He presents again yesterday for similar complaints and was found to have extensive pulmonary emboli in all the lobes.   Patient states that he has no insurance and he nor his wife do not drive.  Patient plans to have his niece, Cai Anfinson, or aunt, Deeann Saint, to drive him home at discharge from the hospital.   Patient has not seen a physician in the last 11 years.  He states that he is willing to be set up at the [facility] near his apartment in [place], [place]. Patient states he was interesting in obtaining disability similar to his wife at home.  Patient states he does not have a regular pharmacy and is willing to receiving discharge medications from the [place] upon discharge from the hospital.  Patient states he has been ambulating in the hospital hallways without difficulty and does not have or need DME at the time.  I plan to give the patient information regarding cessation of alcohol and drug use.  No other barriers to discharge were noted.  Patient is currently receiving Heparin IV at the bedside.           Patient Goals and CMS Choice        Expected Discharge Plan and Services                                                Prior Living Arrangements/Services                       Activities of Daily Living Home Assistive Devices/Equipment: None ADL Screening (condition at time of admission) Patient's cognitive ability adequate to safely complete daily activities?: Yes Is the  patient deaf or have difficulty hearing?: No Does the patient have difficulty seeing, even when wearing glasses/contacts?: No Does the patient have difficulty concentrating, remembering, or making decisions?: No Patient able to express need for assistance with ADLs?: Yes Does the patient have difficulty dressing or bathing?: No Independently performs ADLs?: Yes (appropriate for developmental age) Does the patient have difficulty walking or climbing stairs?: No Weakness of Legs: None Weakness of Arms/Hands: None  Permission Sought/Granted                  Emotional Assessment              Admission diagnosis:  Bilateral pulmonary embolism (HCC) [I26.99] Pulmonary emboli ([place]) [I26.99] Patient Active Problem List   Diagnosis Date Noted  . Pulmonary embolism ([place]) 04/28/2019  . Alcoholic intoxication without complication ([place]) 40/98/1191  . Elevated troponin 04/28/2019  . Pulmonary emboli ([place]) 04/28/2019   PCP:  Patient, No Pcp Per Pharmacy:  No Pharmacies Listed    Social Determinants of Health (SDOH) Interventions    Readmission Risk Interventions No flowsheet data found.

## 2019-04-30 NOTE — Progress Notes (Addendum)
PROGRESS NOTE    Luis Gilbert  FWY:[id number] DOB: 1965/12/29 DOA: 04/28/2019 PCP: Patient, No Pcp Per     Brief Narrative:  54 year old BM PMHx EtOH abuse, Polysubstance abuse presents with left-sided chest pain on 04/27/2019.  He signed out [place] before being admitted for further evaluation.    He presents again yesterday for similar complaints and was found to have extensive pulmonary emboli in all the lobes.  He was started on IV heparin and referred to [facility] for admission.  Patient is currently on room air with oxygen sats greater than 95% and reports his chest pain is improving.   Subjective: 3/4 A/O x4, negative CP, positive DOE, negative S OB.  Negative L LE.   Assessment & Plan:   Principal Problem:   Pulmonary embolism (HCC) Active Problems:   Alcoholic intoxication without complication (HCC)   Elevated troponin   Pulmonary emboli (HCC)   Acute deep vein thrombosis (DVT) of left lower extremity (HCC)   Hepatic steatosis   Alcohol abuse with intoxication (HCC)   Polysubstance abuse (HCC)   Hypokalemia  Bilateral pulmonary emboli extensive/Positive LEFT lower extremity DVT -Continue IV heparin for total of 48 to 72 hours -See goals of care; will need to start on apixaban versus Xarelto dependent on cost -Echocardiogram; EF 55 to 27%, diastolic CHF see results below -3/3 lower extremity Doppler; positive acute DVT, see results below -Patient ambulated [place] hallway greater than 650 ft unassisted. SpO2 monitored throughout. Lowest reading was 96%, average was 98%. HR steady during walk at 113 bpm. Pt denied shortness of breath, was able to carry on conversation, and denied any chest pain.  -Does not meet criteria for home O2 -3/4 Apixaban per pharmacy -Patient will [facility] ([facility]   Elevated liver enzyme/Hepatic Steatosis -Most likely secondary EtOH abuse -Liver ultrasound; consistent with hepatocellular disease see results  below  Mild elevated troponins -Probably from right heart strain from bilateral PE -Continue to monitor. Results for ALY, HAUSER (MRN [id number]) as of 05/01/2019 17:41  Ref. Range 04/30/2019 20:24 04/30/2019 22:18 05/01/2019 00:20  Troponin I (High Sensitivity) Latest Ref Range: <18 ng/L 23 (H) 21 (H) 19 (H)  -Continue down  EtOH intoxication -On admission EtOH level= 164 -Continue CIWA protocol  Polysubstance abuse -3/3 urine tox screen positive marijuana  Hypokalemia -Potassium goal> 4    Goals of care -3/3 consult LCSW;  patient needs follow-up doctor's appointment.  States does not have physician.  What is the best p.o. anticoagulant to start patient on; insurance? -EtOH abuse     DVT prophylaxis: Heparin drip--> Apixaban Code Status: Full Family Communication:  Disposition Plan: TBD   Consultants:    Procedures/Significant Events:  CTA chest PE protocol 3/2 Echocardiogram; left ventricular ejection fraction of 55 to 60%, no regional wall abnormalities, left ventricular diastolic parameters are consistent with grade 1 diastolic dysfunction.  Right ventricular free wall is mildly hypokinetic, apex contracts normally, consistent with acute PE right ventricular systolic function is mildly reduced. 3/2 Ultrasound abdomen;Diffusely echogenic liver consistent with hepatic steatosis and or hepatocellular disease. 2. Negative for gallstones 3/3 lower extremity Doppler; positive acute DVT left popliteal vein, left posterior tibial veins, left peroneal veins, and left gastrocnemius veins.    I have personally reviewed and interpreted all radiology studies and my findings are as above.  VENTILATOR SETTINGS:    Cultures   Antimicrobials:    Devices    LINES / TUBES:      Continuous Infusions: .  sodium chloride    . heparin 1,650 Units/hr (05/01/19 1037)  . lactated ringers 75 mL/hr at 05/01/19 1454     Objective: Vitals:   05/01/19 1050 05/01/19 1145  05/01/19 1318 05/01/19 1723  BP:  (!) 143/94 (!) 135/98 (!) 148/106  Pulse:  97 99 91  Resp:  18 20 18   Temp:  97.9 F (36.6 C) 97.8 F (36.6 C) 97.6 F (36.4 C)  TempSrc:  Oral Oral Oral  SpO2: 96% 96% 98% 97%  Weight:      Height:        Intake/Output Summary (Last 24 hours) at 05/01/2019 1745 Last data filed at 05/01/2019 1300 Gross per 24 hour  Intake 4453.86 ml  Output --  Net 4453.86 ml   Filed Weights   04/28/19 2351 04/29/19 0330 05/01/19 0515  Weight: 90.4 kg 90.4 kg 93.4 kg   Physical Exam:  General: A/O x4, no acute respiratory distress Eyes: negative scleral hemorrhage, negative anisocoria, negative icterus ENT: Negative Runny nose, negative gingival bleeding, Neck:  Negative scars, masses, torticollis, lymphadenopathy, JVD Lungs: Clear to auscultation bilaterally without wheezes or crackles Cardiovascular: Regular rate and rhythm without murmur gallop or rub normal S1 and S2 Abdomen: negative abdominal pain, nondistended, positive soft, bowel sounds, no rebound, no ascites, no appreciable mass Extremities: No significant cyanosis, clubbing, or edema bilateral lower extremities Skin: Negative rashes, lesions, ulcers Psychiatric:  Negative depression, negative anxiety, negative fatigue, negative mania  Central nervous system:  Cranial nerves II through XII intact, tongue/uvula midline, all extremities muscle strength 5/5, sensation intact throughout, negative dysarthria, negative expressive aphasia, negative receptive aphasia.  .     Data Reviewed: Care during the described time interval was provided by me .  I have reviewed this patient's available data, including medical history, events of note, physical examination, and all test results as part of my evaluation.  CBC: Recent Labs  Lab 04/27/19 2130 04/28/19 1857 04/29/19 0720 04/30/19 0259 05/01/19 0020  WBC 5.4 5.5 5.2 5.8 5.9  NEUTROABS 2.8 2.9  --   --   --   HGB 13.8 13.7 14.2 13.3 12.0*  HCT 41.0  40.2 41.4 38.5* 35.7*  MCV 100.7* 99.8 98.6 97.7 100.6*  PLT 217 246 245 240 242   Basic Metabolic Panel: Recent Labs  Lab 04/27/19 2130 04/28/19 1857 04/30/19 0916 05/01/19 0020  NA 136 132* 136 137  K 4.1 4.0 3.3* 3.9  CL 102 99 99 105  CO2 19* 17* 24 22  GLUCOSE 83 79 142* 111*  BUN 11 12 13 13   CREATININE 0.86 0.94 1.07 1.01  CALCIUM 8.6* 8.4* 8.6* 8.6*  MG  --   --  1.8 1.8  PHOS  --   --  3.6 4.2   GFR: Estimated Creatinine Clearance: 100.4 mL/min (by C-G formula based on SCr of 1.01 mg/dL). Liver Function Tests: Recent Labs  Lab 04/27/19 2130 04/28/19 1857 04/30/19 0916 05/01/19 0020  AST 127* 115* 53* 53*  ALT 170* 179* 101* 90*  ALKPHOS 62 64 58 70  BILITOT 0.9 0.8 1.3* 0.6  PROT 8.0 8.0 6.8 6.4*  ALBUMIN 3.8 3.7 3.0* 3.0*   No results for input(s): LIPASE, AMYLASE in the last 168 hours. No results for input(s): AMMONIA in the last 168 hours. Coagulation Profile: Recent Labs  Lab 04/28/19 1857  INR 1.0   Cardiac Enzymes: No results for input(s): CKTOTAL, CKMB, CKMBINDEX, TROPONINI in the last 168 hours. BNP (last 3 results) No results for input(s): PROBNP in  the last 8760 hours. HbA1C: No results for input(s): HGBA1C in the last 72 hours. CBG: No results for input(s): GLUCAP in the last 168 hours. Lipid Profile: No results for input(s): CHOL, HDL, LDLCALC, TRIG, CHOLHDL, LDLDIRECT in the last 72 hours. Thyroid Function Tests: No results for input(s): TSH, T4TOTAL, FREET4, T3FREE, THYROIDAB in the last 72 hours. Anemia Panel: No results for input(s): VITAMINB12, FOLATE, FERRITIN, TIBC, IRON, RETICCTPCT in the last 72 hours. Sepsis Labs: No results for input(s): PROCALCITON, LATICACIDVEN in the last 168 hours.  Recent Results (from the past 240 hour(s))  SARS CORONAVIRUS 2 (TAT 6-24 HRS) Nasopharyngeal Nasopharyngeal Swab     Status: None   Collection Time: 04/28/19 10:38 PM   Specimen: Nasopharyngeal Swab  Result Value Ref Range Status    SARS Coronavirus 2 NEGATIVE NEGATIVE Final    Comment: (NOTE) SARS-CoV-2 target nucleic acids are NOT DETECTED. The SARS-CoV-2 RNA is generally detectable in upper and lower respiratory specimens during the acute phase of infection. Negative results do not preclude SARS-CoV-2 infection, do not rule out co-infections with other pathogens, and should not be used as the sole basis for treatment or other patient management decisions. Negative results must be combined with clinical observations, patient history, and epidemiological information. The expected result is Negative. Fact Sheet for Patients: SugarRoll.be Fact Sheet for Healthcare Providers: https://www.Ryian Lynde-mathews.com/ This test is not yet approved or cleared by the [place] FDA and  has been authorized for detection and/or diagnosis of SARS-CoV-2 by FDA under an Emergency Use Authorization (EUA). This EUA will remain  in effect (meaning this test can be used) for the duration of the COVID-19 declaration under Section 56 4(b)(1) of the Act, 21 U.S.C. section 360bbb-3(b)(1), unless the authorization is terminated or revoked sooner. Performed at [facility], [place] [address] [place], [place] [address]          Radiology Studies: US Abdomen Limited  Result Date: 04/29/2019 CLINICAL DATA:  Elevated LFT EXAM: ULTRASOUND ABDOMEN LIMITED RIGHT UPPER QUADRANT COMPARISON:  CT 07/16/1998 FINDINGS: Gallbladder: No gallstones or wall thickening visualized. No sonographic Murphy sign noted by sonographer. Common bile duct: Diameter: 4.7 mm Liver: Echogenic liver. No focal hepatic abnormality. Portal vein is patent on color Doppler imaging with normal direction of blood flow towards the liver. Other: None. IMPRESSION: 1. Diffusely echogenic liver consistent with hepatic steatosis and or hepatocellular disease. 2. Negative for gallstones Electronically Signed   By: Donavan Foil M.D.   On:  04/29/2019 22:00   VAS [place] LOWER EXTREMITY VENOUS (DVT)  Result Date: 04/30/2019  Lower Venous DVTStudy Indications: Pulmonary embolism.  Risk Factors: Confirmed PE. Anticoagulation: Heparin. Comparison Study: No prior studies. Performing Technologist: Oliver Hum RVT  Examination Guidelines: A complete evaluation includes B-mode imaging, spectral Doppler, color Doppler, and power Doppler as needed of all accessible portions of each vessel. Bilateral testing is considered an integral part of a complete examination. Limited examinations for reoccurring indications may be performed as noted. The reflux portion of the exam is performed with the patient in reverse Trendelenburg.  +---------+---------------+---------+-----------+----------+--------------+ RIGHT    CompressibilityPhasicitySpontaneityPropertiesThrombus Aging +---------+---------------+---------+-----------+----------+--------------+ CFV      Full           Yes      Yes                                 +---------+---------------+---------+-----------+----------+--------------+ SFJ      Full                                                        +---------+---------------+---------+-----------+----------+--------------+  FV Prox  Full                                                        +---------+---------------+---------+-----------+----------+--------------+ FV Mid   Full                                                        +---------+---------------+---------+-----------+----------+--------------+ FV DistalFull                                                        +---------+---------------+---------+-----------+----------+--------------+ PFV      Full                                                        +---------+---------------+---------+-----------+----------+--------------+ POP      Full           Yes      Yes                                  +---------+---------------+---------+-----------+----------+--------------+ PTV      Full                                                        +---------+---------------+---------+-----------+----------+--------------+ PERO     Full                                                        +---------+---------------+---------+-----------+----------+--------------+   +---------+---------------+---------+-----------+----------+--------------+ LEFT     CompressibilityPhasicitySpontaneityPropertiesThrombus Aging +---------+---------------+---------+-----------+----------+--------------+ CFV      Full           Yes      Yes                                 +---------+---------------+---------+-----------+----------+--------------+ SFJ      Full                                                        +---------+---------------+---------+-----------+----------+--------------+ FV Prox  Full                                                        +---------+---------------+---------+-----------+----------+--------------+  FV Mid   Full                                                        +---------+---------------+---------+-----------+----------+--------------+ FV DistalFull                                                        +---------+---------------+---------+-----------+----------+--------------+ PFV      Full                                                        +---------+---------------+---------+-----------+----------+--------------+ POP      None           No       No                   Acute          +---------+---------------+---------+-----------+----------+--------------+ PTV      None                                         Acute          +---------+---------------+---------+-----------+----------+--------------+ PERO     Partial                                      Acute           +---------+---------------+---------+-----------+----------+--------------+ Gastroc  Partial                                      Acute          +---------+---------------+---------+-----------+----------+--------------+     Summary: RIGHT: - There is no evidence of deep vein thrombosis in the lower extremity.  - No cystic structure found in the popliteal fossa.  LEFT: - Findings consistent with acute deep vein thrombosis involving the left popliteal vein, left posterior tibial veins, left peroneal veins, and left gastrocnemius veins. - No cystic structure found in the popliteal fossa.  *See table(s) above for measurements and observations. Electronically signed by Harold Barban MD on 04/30/2019 at 8:56:53 PM.    Final         Scheduled Meds: . folic acid  1 mg Oral Daily  . multivitamin with minerals  1 tablet Oral Daily  . nicotine  21 mg Transdermal Daily  . sodium chloride flush  3 mL Intravenous Q12H  . thiamine  100 mg Oral Daily   Continuous Infusions: . sodium chloride    . heparin 1,650 Units/hr (05/01/19 1037)  . lactated ringers 75 mL/hr at 05/01/19 1454     LOS: 3 days    Time spent:40 min    Yerachmiel Spinney, Geraldo Docker, MD [facility] Pager [phone]  If 7PM-7AM, please contact night-coverage [email or web address] Password [facility] 05/01/2019,  5:45 PM

## 2019-04-30 NOTE — Progress Notes (Signed)
Bilateral lower extremity venous duplex has been completed. Preliminary results can be found in CV Proc through chart review.  Results were given to the patient's nurse, Sharyn Lull.  04/30/19 11:26 AM Luis Gilbert RVT

## 2019-05-01 DIAGNOSIS — I82402 Acute embolism and thrombosis of unspecified deep veins of left lower extremity: Secondary | ICD-10-CM | POA: Diagnosis present

## 2019-05-01 DIAGNOSIS — K760 Fatty (change of) liver, not elsewhere classified: Secondary | ICD-10-CM | POA: Diagnosis present

## 2019-05-01 DIAGNOSIS — F1910 Other psychoactive substance abuse, uncomplicated: Secondary | ICD-10-CM | POA: Diagnosis present

## 2019-05-01 DIAGNOSIS — E876 Hypokalemia: Secondary | ICD-10-CM | POA: Diagnosis present

## 2019-05-01 DIAGNOSIS — F10129 Alcohol abuse with intoxication, unspecified: Secondary | ICD-10-CM | POA: Diagnosis present

## 2019-05-01 LAB — COMPREHENSIVE METABOLIC PANEL
ALT: 90 U/L — ABNORMAL HIGH (ref 0–44)
AST: 53 U/L — ABNORMAL HIGH (ref 15–41)
Albumin: 3 g/dL — ABNORMAL LOW (ref 3.5–5.0)
Alkaline Phosphatase: 70 U/L (ref 38–126)
Anion gap: 10 (ref 5–15)
BUN: 13 mg/dL (ref 6–20)
CO2: 22 mmol/L (ref 22–32)
Calcium: 8.6 mg/dL — ABNORMAL LOW (ref 8.9–10.3)
Chloride: 105 mmol/L (ref 98–111)
Creatinine, Ser: 1.01 mg/dL (ref 0.61–1.24)
GFR calc Af Amer: >60 mL/min (ref >60)
GFR calc non Af Amer: >60 mL/min (ref >60)
Glucose, Bld: 111 mg/dL — ABNORMAL HIGH (ref 70–99)
Potassium: 3.9 mmol/L (ref 3.5–5.1)
Sodium: 137 mmol/L (ref 135–145)
Total Bilirubin: 0.6 mg/dL (ref 0.3–1.2)
Total Protein: 6.4 g/dL — ABNORMAL LOW (ref 6.5–8.1)

## 2019-05-01 LAB — CBC
HCT: 35.7 % — ABNORMAL LOW (ref 39.0–52.0)
Hemoglobin: 12 g/dL — ABNORMAL LOW (ref 13.0–17.0)
MCH: 33.8 pg (ref 26.0–34.0)
MCHC: 33.6 g/dL (ref 30.0–36.0)
MCV: 100.6 fL — ABNORMAL HIGH (ref 80.0–100.0)
Platelets: 227 10*3/uL (ref 150–400)
RBC: 3.55 MIL/uL — ABNORMAL LOW (ref 4.22–5.81)
RDW: 12.5 % (ref 11.5–15.5)
WBC: 5.9 10*3/uL (ref 4.0–10.5)
nRBC: 0 % (ref 0.0–0.2)

## 2019-05-01 LAB — TROPONIN I (HIGH SENSITIVITY): Troponin I (High Sensitivity): 19 ng/L — ABNORMAL HIGH (ref <18)

## 2019-05-01 LAB — MAGNESIUM: Magnesium: 1.8 mg/dL (ref 1.7–2.4)

## 2019-05-01 LAB — HEPARIN LEVEL (UNFRACTIONATED): Heparin Unfractionated: 0.51 IU/mL (ref 0.30–0.70)

## 2019-05-01 LAB — PHOSPHORUS: Phosphorus: 4.2 mg/dL (ref 2.5–4.6)

## 2019-05-01 MED ORDER — APIXABAN 5 MG PO TABS: 5.0000 mg | ORAL_TABLET | Freq: Two times a day (BID) | ORAL | Status: DC

## 2019-05-01 MED ORDER — APIXABAN 5 MG PO TABS
10.0000 mg | ORAL_TABLET | Freq: Two times a day (BID) | ORAL | Status: DC
  Administered 2019-05-01 – 2019-05-02 (×2): 10 mg via ORAL
  Filled 2019-05-01 (×2): qty 2

## 2019-05-01 NOTE — Discharge Instructions (Addendum)
Information on my medicine - ELIQUIS (apixaban)  This medication education was reviewed with me or my healthcare representative as part of my discharge preparation.    Why was Eliquis prescribed for you? Eliquis was prescribed to treat blood clots that may have been found in the veins of your legs (deep vein thrombosis) or in your lungs (pulmonary embolism) and to reduce the risk of them occurring again.  What do You need to know about Eliquis ? The starting dose is 10 mg (two 5 mg tablets) taken TWICE daily for the FIRST SEVEN (7) DAYS, then on 05/09/19  the dose is reduced to ONE 5 mg tablet taken TWICE daily.  Eliquis may be taken with or without food.   Try to take the dose about the same time in the morning and in the evening. If you have difficulty swallowing the tablet whole please discuss with your pharmacist how to take the medication safely.  Take Eliquis exactly as prescribed and DO NOT stop taking Eliquis without talking to the doctor who prescribed the medication.  Stopping may increase your risk of developing a new blood clot.  Refill your prescription before you run out.  After discharge, you should have regular check-up appointments with your healthcare provider that is prescribing your Eliquis.    What do you do if you miss a dose? If a dose of ELIQUIS is not taken at the scheduled time, take it as soon as possible on the same day and twice-daily administration should be resumed. The dose should not be doubled to make up for a missed dose.  Important Safety Information A possible side effect of Eliquis is bleeding. You should call your healthcare provider right away if you experience any of the following: ? Bleeding from an injury or your nose that does not stop. ? Unusual colored urine (red or dark brown) or unusual colored stools (red or black). ? Unusual bruising for unknown reasons. ? A serious fall or if you hit your head (even if there is no bleeding).  Some  medicines may interact with Eliquis and might increase your risk of bleeding or clotting while on Eliquis. To help avoid this, consult your healthcare provider or pharmacist prior to using any new prescription or non-prescription medications, including herbals, vitamins, non-steroidal anti-inflammatory drugs (NSAIDs) and supplements.  This website has more information on Eliquis (apixaban): http://www.eliquis.com/eliquis/home

## 2019-05-01 NOTE — Progress Notes (Signed)
[place] for heparin Indication: pulmonary embolus  No Known Allergies  Patient Measurements: Height: 6' (182.9 cm) Weight: 205 lb 14.6 oz (93.4 kg) IBW/kg (Calculated) : 77.6 Heparin Dosing Weight: 86 kg  Vital Signs: Temp: 98.3 F (36.8 C) (03/04 0800) Temp Source: Oral (03/04 0515) BP: 122/74 (03/04 0800) Pulse Rate: 93 (03/04 0800)  Labs: Recent Labs    04/28/19 1857 04/28/19 2019 04/29/19 0720 04/29/19 0720 04/29/19 1457 04/30/19 0259 04/30/19 0916 04/30/19 2024 04/30/19 2218 05/01/19 0020  HGB 13.7   < > 14.2   < >  --  13.3  --   --   --  12.0*  HCT 40.2   < > 41.4  --   --  38.5*  --   --   --  35.7*  PLT 246   < > 245  --   --  240  --   --   --  227  APTT 28  --   --   --   --   --   --   --   --   --   LABPROT 13.5  --   --   --   --   --   --   --   --   --   INR 1.0  --   --   --   --   --   --   --   --   --   HEPARINUNFRC  --   --  0.25*   < > 0.44  --  0.44  --   --  0.51  CREATININE 0.94  --   --   --   --   --  1.07  --   --  1.01  TROPONINIHS 74*   < >  --   --   --   --   --  23* 21* 19*   < > = values in this interval not displayed.    Estimated Creatinine Clearance: 100.4 mL/min (by C-G formula based on SCr of 1.01 mg/dL).   Medical History: Past Medical History:  Diagnosis Date  . Cancer ([place])   . PE (pulmonary thromboembolism) ([place]) 04/2019    Medications:  No medications prior to admission.   Scheduled:  . folic acid  1 mg Oral Daily  . multivitamin with minerals  1 tablet Oral Daily  . nicotine  21 mg Transdermal Daily  . sodium chloride flush  3 mL Intravenous Q12H  . thiamine  100 mg Oral Daily   Infusions:  . sodium chloride    . heparin 1,650 Units/hr (05/01/19 0700)  . lactated ringers 75 mL/hr at 05/01/19 0700    Assessment: Pt was seen in the ED yesterday before leaving AMA. He presented again with bilateral PE and also noted with LLE DVT. Pharmacy dosing IV heparin. Plans  noted for DOAC if this is affordable.  -heparin level at goal   Goal of Therapy:  Heparin level 0.3-0.7 units/ml Monitor platelets by anticoagulation protocol: Yes   Plan:  -No heparin changes needed -Daily heparin level and CBC  Hildred Laser, PharmD Clinical Pharmacist **Pharmacist phone directory can now be found on [email or web address] ([email or web address]).  Listed under [place].

## 2019-05-01 NOTE — Progress Notes (Signed)
ANTICOAGULATION CONSULT NOTE  Pharmacy Consult:  Heparin >> Eliquis Indication: pulmonary embolus  No Known Allergies  Patient Measurements: Height: 6' (182.9 cm) Weight: 205 lb 14.6 oz (93.4 kg) IBW/kg (Calculated) : 77.6  Vital Signs: Temp: 97.6 F (36.4 C) (03/04 1723) Temp Source: Oral (03/04 1723) BP: 148/106 (03/04 1723) Pulse Rate: 91 (03/04 1723)  Labs: Recent Labs    04/28/19 1857 04/28/19 2019 04/29/19 0720 04/29/19 0720 04/29/19 1457 04/30/19 0259 04/30/19 0916 04/30/19 2024 04/30/19 2218 05/01/19 0020  HGB 13.7   < > 14.2   < >  --  13.3  --   --   --  12.0*  HCT 40.2   < > 41.4  --   --  38.5*  --   --   --  35.7*  PLT 246   < > 245  --   --  240  --   --   --  227  APTT 28  --   --   --   --   --   --   --   --   --   LABPROT 13.5  --   --   --   --   --   --   --   --   --   INR 1.0  --   --   --   --   --   --   --   --   --   HEPARINUNFRC  --   --  0.25*   < > 0.44  --  0.44  --   --  0.51  CREATININE 0.94  --   --   --   --   --  1.07  --   --  1.01  TROPONINIHS 74*   < >  --   --   --   --   --  23* 21* 19*   < > = values in this interval not displayed.    Estimated Creatinine Clearance: 100.4 mL/min (by C-G formula based on SCr of 1.01 mg/dL).   Assessment: Pt was seen in the ED the day prior and left AMA. He presented again with bilateral PE and also noted with LLE DVT.  He has been on IV heparin and now to transition to Eliquis.  No bleeding reported.  Goal of Therapy:  Appropriate anticoagulation Monitor platelets by anticoagulation protocol: Yes   Plan:  Stop IV heparin when Eliquis is administered Eliquis 10mg  PO BID x 7 days, then on 05/08/19 PM start 5mg  PO  BID Pharmacy will sign off and follow peripherally.  Thank you for the consult!  Senovia Gauer D. Mina Marble, PharmD, BCPS, [place] 05/01/2019, 5:48 PM

## 2019-05-01 NOTE — TOC Progression Note (Signed)
Transition of Care [facility]) - Progression Note    Patient Details  Name: Massiah Minjares MRN: [id number] Date of Birth: [birth year]  [facility]) CM/SW [place], RN Phone Number: 05/01/2019, 4:14 PM  Clinical Narrative:     Case Management spoke with the patient yesterday in regards to a viable PCP in [facility].  Patient states he would be willing to go to the [facility] [phone]. The patient and his wife both do not drive but have family members who provide transportation as needed.  I called to the [facility] office this morning and was directed to give the patient the number to [facility] at [phone] to call and get financial screening with them and that they would set up his appointment with the free clinic.  Patient was given instructions to call the [facility] number from the hospital room now to make his appointment.  I visited the patient in his room and gave Mr. Waymire information regarding help with cessation of alcohol use.  Patient received this information for outpatient help for addiction and alcohol abuse in the area.    Patient given Eliquis patient medication assistance paperwork to fill out and mail so that he would have affordable medication assistance.  Patient given directions as to the importance of completing the paperwork and medication compliance to avoid future medical risks for PE/DVT.  Patient verbalized understanding of this importance.        Expected Discharge Plan and Services                                                 Social Determinants of Health (SDOH) Interventions    Readmission Risk Interventions Readmission Risk Prevention Plan 04/30/2019  Post Dischage Appt Complete  Medication Screening Complete  Transportation Screening Complete  Some recent data might be hidden

## 2019-05-01 NOTE — Progress Notes (Signed)
Patient ambulated [place] hallway greater than 650 ft unassisted. SpO2 monitored throughout. Lowest reading was 96%, average was 98%. HR steady during walk at 113 bpm. Pt denied shortness of breath, was able to carry on conversation, and denied any chest pain.  Verlon Au, RN, BSN 12:45 PM 05/01/2019

## 2019-05-01 NOTE — Progress Notes (Signed)
PROGRESS NOTE    Luis Gilbert  TDD:[id number] DOB: [birth year] DOA: 04/28/2019 PCP: Patient, No Pcp Per     Brief Narrative:  54 year old BM PMHx EtOH abuse, Polysubstance abuse presents with left-sided chest pain on 04/27/2019.  He signed out [place] before being admitted for further evaluation.    He presents again yesterday for similar complaints and was found to have extensive pulmonary emboli in all the lobes.  He was started on IV heparin and referred to [facility] for admission.  Patient is currently on room air with oxygen sats greater than 95% and reports his chest pain is improving.   Subjective: 3/4 A/O x4, negative CP, positive DOE, negative S OB.  Negative L LE.   Assessment & Plan:   Principal Problem:   Pulmonary embolism (HCC) Active Problems:   Alcoholic intoxication without complication (HCC)   Elevated troponin   Pulmonary emboli (HCC)   Acute deep vein thrombosis (DVT) of left lower extremity (HCC)   Hepatic steatosis   Alcohol abuse with intoxication (HCC)   Polysubstance abuse (HCC)   Hypokalemia  Bilateral pulmonary emboli extensive/Positive LEFT lower extremity DVT -Continue IV heparin for total of 48 to 72 hours -See goals of care; will need to start on apixaban versus Xarelto dependent on cost -Echocardiogram; EF 55 to 62%, diastolic CHF see results below -3/3 lower extremity Doppler; positive acute DVT, see results below -Patient ambulated [place] hallway greater than 650 ft unassisted. SpO2 monitored throughout. Lowest reading was 96%, average was 98%. HR steady during walk at 113 bpm. Pt denied shortness of breath, was able to carry on conversation, and denied any chest pain.  -Does not meet criteria for home O2 -3/4 Apixaban per pharmacy -Patient will [facility] care with [facility]   Elevated liver enzyme/Hepatic Steatosis -Most likely secondary EtOH abuse -Liver ultrasound; consistent with hepatocellular disease see results  below  Mild elevated troponins -Probably from right heart strain from bilateral PE -Continue to monitor. Results for Luis Gilbert, Luis Gilbert (MRN [id number]) as of 05/01/2019 17:41  Ref. Range 04/30/2019 20:24 04/30/2019 22:18 05/01/2019 00:20  Troponin I (High Sensitivity) Latest Ref Range: <18 ng/L 23 (H) 21 (H) 19 (H)  -Continue down  EtOH intoxication -On admission EtOH level= 164 -Continue CIWA protocol  Polysubstance abuse -3/3 urine tox screen positive marijuana  Hypokalemia -Potassium goal> 4    Goals of care -3/3 consult LCSW;  patient needs follow-up doctor's appointment.  States does not have physician.  What is the best p.o. anticoagulant to start patient on; insurance? -EtOH abuse     DVT prophylaxis: Heparin drip--> Apixaban Code Status: Full Family Communication:  Disposition Plan: TBD   Consultants:    Procedures/Significant Events:  CTA chest PE protocol 3/2 Echocardiogram; left ventricular ejection fraction of 55 to 60%, no regional wall abnormalities, left ventricular diastolic parameters are consistent with grade 1 diastolic dysfunction.  Right ventricular free wall is mildly hypokinetic, apex contracts normally, consistent with acute PE right ventricular systolic function is mildly reduced. 3/2 Ultrasound abdomen;Diffusely echogenic liver consistent with hepatic steatosis and or hepatocellular disease. 2. Negative for gallstones 3/3 lower extremity Doppler; positive acute DVT left popliteal vein, left posterior tibial veins, left peroneal veins, and left gastrocnemius veins.    I have personally reviewed and interpreted all radiology studies and my findings are as above.  VENTILATOR SETTINGS:    Cultures   Antimicrobials:    Devices    LINES / TUBES:      Continuous Infusions: .  sodium chloride    . heparin 1,650 Units/hr (05/01/19 1037)  . lactated ringers 75 mL/hr at 05/01/19 1454     Objective: Vitals:   05/01/19 1050 05/01/19 1145  05/01/19 1318 05/01/19 1723  BP:  (!) 143/94 (!) 135/98 (!) 148/106  Pulse:  97 99 91  Resp:  18 20 18   Temp:  97.9 F (36.6 C) 97.8 F (36.6 C) 97.6 F (36.4 C)  TempSrc:  Oral Oral Oral  SpO2: 96% 96% 98% 97%  Weight:      Height:        Intake/Output Summary (Last 24 hours) at 05/01/2019 1747 Last data filed at 05/01/2019 1300 Gross per 24 hour  Intake 4453.86 ml  Output --  Net 4453.86 ml   Filed Weights   04/28/19 2351 04/29/19 0330 05/01/19 0515  Weight: 90.4 kg 90.4 kg 93.4 kg   Physical Exam:  General: A/O x4, no acute respiratory distress Eyes: negative scleral hemorrhage, negative anisocoria, negative icterus ENT: Negative Runny nose, negative gingival bleeding, Neck:  Negative scars, masses, torticollis, lymphadenopathy, JVD Lungs: Clear to auscultation bilaterally without wheezes or crackles Cardiovascular: Regular rate and rhythm without murmur gallop or rub normal S1 and S2 Abdomen: negative abdominal pain, nondistended, positive soft, bowel sounds, no rebound, no ascites, no appreciable mass Extremities: No significant cyanosis, clubbing, or edema bilateral lower extremities Skin: Negative rashes, lesions, ulcers Psychiatric:  Negative depression, negative anxiety, negative fatigue, negative mania  Central nervous system:  Cranial nerves II through XII intact, tongue/uvula midline, all extremities muscle strength 5/5, sensation intact throughout, negative dysarthria, negative expressive aphasia, negative receptive aphasia.  .     Data Reviewed: Care during the described time interval was provided by me .  I have reviewed this patient's available data, including medical history, events of note, physical examination, and all test results as part of my evaluation.  CBC: Recent Labs  Lab 04/27/19 2130 04/28/19 1857 04/29/19 0720 04/30/19 0259 05/01/19 0020  WBC 5.4 5.5 5.2 5.8 5.9  NEUTROABS 2.8 2.9  --   --   --   HGB 13.8 13.7 14.2 13.3 12.0*  HCT 41.0  40.2 41.4 38.5* 35.7*  MCV 100.7* 99.8 98.6 97.7 100.6*  PLT 217 246 245 240 016   Basic Metabolic Panel: Recent Labs  Lab 04/27/19 2130 04/28/19 1857 04/30/19 0916 05/01/19 0020  NA 136 132* 136 137  K 4.1 4.0 3.3* 3.9  CL 102 99 99 105  CO2 19* 17* 24 22  GLUCOSE 83 79 142* 111*  BUN 11 12 13 13   CREATININE 0.86 0.94 1.07 1.01  CALCIUM 8.6* 8.4* 8.6* 8.6*  MG  --   --  1.8 1.8  PHOS  --   --  3.6 4.2   GFR: Estimated Creatinine Clearance: 100.4 mL/min (by C-G formula based on SCr of 1.01 mg/dL). Liver Function Tests: Recent Labs  Lab 04/27/19 2130 04/28/19 1857 04/30/19 0916 05/01/19 0020  AST 127* 115* 53* 53*  ALT 170* 179* 101* 90*  ALKPHOS 62 64 58 70  BILITOT 0.9 0.8 1.3* 0.6  PROT 8.0 8.0 6.8 6.4*  ALBUMIN 3.8 3.7 3.0* 3.0*   No results for input(s): LIPASE, AMYLASE in the last 168 hours. No results for input(s): AMMONIA in the last 168 hours. Coagulation Profile: Recent Labs  Lab 04/28/19 1857  INR 1.0   Cardiac Enzymes: No results for input(s): CKTOTAL, CKMB, CKMBINDEX, TROPONINI in the last 168 hours. BNP (last 3 results) No results for input(s): PROBNP in  the last 8760 hours. HbA1C: No results for input(s): HGBA1C in the last 72 hours. CBG: No results for input(s): GLUCAP in the last 168 hours. Lipid Profile: No results for input(s): CHOL, HDL, LDLCALC, TRIG, CHOLHDL, LDLDIRECT in the last 72 hours. Thyroid Function Tests: No results for input(s): TSH, T4TOTAL, FREET4, T3FREE, THYROIDAB in the last 72 hours. Anemia Panel: No results for input(s): VITAMINB12, FOLATE, FERRITIN, TIBC, IRON, RETICCTPCT in the last 72 hours. Sepsis Labs: No results for input(s): PROCALCITON, LATICACIDVEN in the last 168 hours.  Recent Results (from the past 240 hour(s))  SARS CORONAVIRUS 2 (TAT 6-24 HRS) Nasopharyngeal Nasopharyngeal Swab     Status: None   Collection Time: 04/28/19 10:38 PM   Specimen: Nasopharyngeal Swab  Result Value Ref Range Status    SARS Coronavirus 2 NEGATIVE NEGATIVE Final    Comment: (NOTE) SARS-CoV-2 target nucleic acids are NOT DETECTED. The SARS-CoV-2 RNA is generally detectable in upper and lower respiratory specimens during the acute phase of infection. Negative results do not preclude SARS-CoV-2 infection, do not rule out co-infections with other pathogens, and should not be used as the sole basis for treatment or other patient management decisions. Negative results must be combined with clinical observations, patient history, and epidemiological information. The expected result is Negative. Fact Sheet for Patients: SugarRoll.be Fact Sheet for Healthcare Providers: https://www.[employer]-mathews.com/ This test is not yet approved or cleared by the [place] FDA and  has been authorized for detection and/or diagnosis of SARS-CoV-2 by FDA under an Emergency Use Authorization (EUA). This EUA will remain  in effect (meaning this test can be used) for the duration of the COVID-19 declaration under Section 56 4(b)(1) of the Act, 21 U.S.C. section 360bbb-3(b)(1), unless the authorization is terminated or revoked sooner. Performed at [facility], [place] [address] [place], [place] [address]          Radiology Studies: US Abdomen Limited  Result Date: 04/29/2019 CLINICAL DATA:  Elevated LFT EXAM: ULTRASOUND ABDOMEN LIMITED RIGHT UPPER QUADRANT COMPARISON:  CT 07/16/1998 FINDINGS: Gallbladder: No gallstones or wall thickening visualized. No sonographic Murphy sign noted by sonographer. Common bile duct: Diameter: 4.7 mm Liver: Echogenic liver. No focal hepatic abnormality. Portal vein is patent on color Doppler imaging with normal direction of blood flow towards the liver. Other: None. IMPRESSION: 1. Diffusely echogenic liver consistent with hepatic steatosis and or hepatocellular disease. 2. Negative for gallstones Electronically Signed   By: Donavan Foil M.D.   On:  04/29/2019 22:00   VAS [place] LOWER EXTREMITY VENOUS (DVT)  Result Date: 04/30/2019  Lower Venous DVTStudy Indications: Pulmonary embolism.  Risk Factors: Confirmed PE. Anticoagulation: Heparin. Comparison Study: No prior studies. Performing Technologist: Oliver Hum RVT  Examination Guidelines: A complete evaluation includes B-mode imaging, spectral Doppler, color Doppler, and power Doppler as needed of all accessible portions of each vessel. Bilateral testing is considered an integral part of a complete examination. Limited examinations for reoccurring indications may be performed as noted. The reflux portion of the exam is performed with the patient in reverse Trendelenburg.  +---------+---------------+---------+-----------+----------+--------------+ RIGHT    CompressibilityPhasicitySpontaneityPropertiesThrombus Aging +---------+---------------+---------+-----------+----------+--------------+ CFV      Full           Yes      Yes                                 +---------+---------------+---------+-----------+----------+--------------+ SFJ      Full                                                        +---------+---------------+---------+-----------+----------+--------------+  FV Prox  Full                                                        +---------+---------------+---------+-----------+----------+--------------+ FV Mid   Full                                                        +---------+---------------+---------+-----------+----------+--------------+ FV DistalFull                                                        +---------+---------------+---------+-----------+----------+--------------+ PFV      Full                                                        +---------+---------------+---------+-----------+----------+--------------+ POP      Full           Yes      Yes                                  +---------+---------------+---------+-----------+----------+--------------+ PTV      Full                                                        +---------+---------------+---------+-----------+----------+--------------+ PERO     Full                                                        +---------+---------------+---------+-----------+----------+--------------+   +---------+---------------+---------+-----------+----------+--------------+ LEFT     CompressibilityPhasicitySpontaneityPropertiesThrombus Aging +---------+---------------+---------+-----------+----------+--------------+ CFV      Full           Yes      Yes                                 +---------+---------------+---------+-----------+----------+--------------+ SFJ      Full                                                        +---------+---------------+---------+-----------+----------+--------------+ FV Prox  Full                                                        +---------+---------------+---------+-----------+----------+--------------+  FV Mid   Full                                                        +---------+---------------+---------+-----------+----------+--------------+ FV DistalFull                                                        +---------+---------------+---------+-----------+----------+--------------+ PFV      Full                                                        +---------+---------------+---------+-----------+----------+--------------+ POP      None           No       No                   Acute          +---------+---------------+---------+-----------+----------+--------------+ PTV      None                                         Acute          +---------+---------------+---------+-----------+----------+--------------+ PERO     Partial                                      Acute           +---------+---------------+---------+-----------+----------+--------------+ Gastroc  Partial                                      Acute          +---------+---------------+---------+-----------+----------+--------------+     Summary: RIGHT: - There is no evidence of deep vein thrombosis in the lower extremity.  - No cystic structure found in the popliteal fossa.  LEFT: - Findings consistent with acute deep vein thrombosis involving the left popliteal vein, left posterior tibial veins, left peroneal veins, and left gastrocnemius veins. - No cystic structure found in the popliteal fossa.  *See table(s) above for measurements and observations. Electronically signed by Harold Barban MD on 04/30/2019 at 8:56:53 PM.    Final         Scheduled Meds: . folic acid  1 mg Oral Daily  . multivitamin with minerals  1 tablet Oral Daily  . nicotine  21 mg Transdermal Daily  . sodium chloride flush  3 mL Intravenous Q12H  . thiamine  100 mg Oral Daily   Continuous Infusions: . sodium chloride    . heparin 1,650 Units/hr (05/01/19 1037)  . lactated ringers 75 mL/hr at 05/01/19 1454     LOS: 3 days    Time spent:40 min    Grey Schlauch, Geraldo Docker, MD [facility] Pager [phone]  If 7PM-7AM, please contact night-coverage [email or web address] Password [facility] 05/01/2019,  5:47 PM

## 2019-05-01 NOTE — TOC Progression Note (Signed)
Transition of Care [facility]) - Progression Note    Patient Details  Name: Luis Gilbert MRN: [id number] Date of Birth: 06-17-1965  Transition of Care [facility]) CM/SW [place], RN Phone Number: 05/01/2019, 11:46 AM  Clinical Narrative:     Case Management called and spoke with the [place] at [phone].  Patient will need to be screened for the free clinic prior to appointment approval through [facility].  [facility] at [phone].  Called and left message to clear the patient and make appointment for primary care and medication assistance for possible blood thinners for discharge.  Patient given patient application for Eliquis for patient assistance.  Patient given numbers for [facility], [facility] and Eliquis medication assistance.  Patient given resources of drug and alcohol dependency as well.  Expected Discharge Plan and Services                                                 Social Determinants of Health (SDOH) Interventions    Readmission Risk Interventions Readmission Risk Prevention Plan 04/30/2019  Post Dischage Appt Complete  Medication Screening Complete  Transportation Screening Complete  Some recent data might be hidden

## 2019-05-02 DIAGNOSIS — I248 Other forms of acute ischemic heart disease: Secondary | ICD-10-CM

## 2019-05-02 DIAGNOSIS — I2609 Other pulmonary embolism with acute cor pulmonale: Secondary | ICD-10-CM

## 2019-05-02 LAB — COMPREHENSIVE METABOLIC PANEL
ALT: 86 U/L — ABNORMAL HIGH (ref 0–44)
AST: 51 U/L — ABNORMAL HIGH (ref 15–41)
Albumin: 2.9 g/dL — ABNORMAL LOW (ref 3.5–5.0)
Alkaline Phosphatase: 61 U/L (ref 38–126)
Anion gap: 11 (ref 5–15)
BUN: 10 mg/dL (ref 6–20)
CO2: 22 mmol/L (ref 22–32)
Calcium: 8.5 mg/dL — ABNORMAL LOW (ref 8.9–10.3)
Chloride: 103 mmol/L (ref 98–111)
Creatinine, Ser: 1.06 mg/dL (ref 0.61–1.24)
GFR calc Af Amer: >60 mL/min (ref >60)
GFR calc non Af Amer: >60 mL/min (ref >60)
Glucose, Bld: 98 mg/dL (ref 70–99)
Potassium: 3.7 mmol/L (ref 3.5–5.1)
Sodium: 136 mmol/L (ref 135–145)
Total Bilirubin: 0.8 mg/dL (ref 0.3–1.2)
Total Protein: 6.2 g/dL — ABNORMAL LOW (ref 6.5–8.1)

## 2019-05-02 LAB — PHOSPHORUS: Phosphorus: 5.3 mg/dL — ABNORMAL HIGH (ref 2.5–4.6)

## 2019-05-02 LAB — MAGNESIUM: Magnesium: 1.6 mg/dL — ABNORMAL LOW (ref 1.7–2.4)

## 2019-05-02 MED ORDER — MAGNESIUM SULFATE 50 % IJ SOLN
3.0000 g | Freq: Once | INTRAVENOUS | Status: DC
  Filled 2019-05-02: qty 6

## 2019-05-02 MED ORDER — ELIQUIS DVT/PE STARTER PACK 5 MG PO TBPK: ORAL_TABLET | ORAL | 0 refills | Status: DC

## 2019-05-02 MED ORDER — APIXABAN 5 MG PO TABS: 5.0000 mg | ORAL_TABLET | Freq: Two times a day (BID) | ORAL | 0 refills | Status: DC

## 2019-05-02 MED ORDER — APIXABAN 5 MG PO TABS: 10.0000 mg | ORAL_TABLET | Freq: Two times a day (BID) | ORAL | Status: DC

## 2019-05-02 MED ORDER — NICOTINE 21 MG/24HR TD PT24: 21.0000 mg | MEDICATED_PATCH | Freq: Every day | TRANSDERMAL | 0 refills | Status: DC

## 2019-05-02 MED ORDER — MAGNESIUM OXIDE 400 (241.3 MG) MG PO TABS
800.0000 mg | ORAL_TABLET | Freq: Once | ORAL | Status: AC
  Administered 2019-05-02: 800 mg via ORAL
  Filled 2019-05-02: qty 2

## 2019-05-02 MED ORDER — FOLIC ACID 1 MG PO TABS: 1.0000 mg | ORAL_TABLET | Freq: Every day | ORAL | 0 refills | Status: DC

## 2019-05-02 MED ORDER — APIXABAN 5 MG PO TABS: 5.0000 mg | ORAL_TABLET | Freq: Two times a day (BID) | ORAL | Status: DC

## 2019-05-02 MED ORDER — ONDANSETRON HCL 4 MG PO TABS: 4.0000 mg | ORAL_TABLET | Freq: Four times a day (QID) | ORAL | 0 refills | Status: DC | PRN

## 2019-05-02 MED ORDER — APIXABAN 5 MG PO TABS: 10.0000 mg | ORAL_TABLET | Freq: Two times a day (BID) | ORAL | 0 refills | Status: DC

## 2019-05-02 MED ORDER — ADULT MULTIVITAMIN W/MINERALS CH: 1.0000 | ORAL_TABLET | Freq: Every day | ORAL | 0 refills | Status: DC

## 2019-05-02 MED ORDER — TRAMADOL HCL 50 MG PO TABS: 50.0000 mg | ORAL_TABLET | Freq: Three times a day (TID) | ORAL | 0 refills | Status: DC | PRN

## 2019-05-02 MED ORDER — THIAMINE HCL 100 MG PO TABS: 100.0000 mg | ORAL_TABLET | Freq: Every day | ORAL | 0 refills | Status: DC

## 2019-05-02 MED ORDER — BISACODYL 10 MG RE SUPP: 10.0000 mg | Freq: Every day | RECTAL | 0 refills | Status: DC | PRN

## 2019-05-02 MED ORDER — DULCOLAX 5 MG PO TBEC: 5.0000 mg | DELAYED_RELEASE_TABLET | Freq: Every day | ORAL | 0 refills | Status: AC | PRN

## 2019-05-02 MED FILL — BISACODYL EC 5 MG TBEC: 5 | 30 days supply | Qty: 30 | Fill #0

## 2019-05-02 MED FILL — VITAMIN B-1 100 MG TABS: 100 | 30 days supply | Qty: 30 | Fill #0

## 2019-05-02 MED FILL — ELIQUIS STARTER PACK 5 MG T: 5 | 30 days supply | Qty: 74 | Fill #0

## 2019-05-02 MED FILL — FOLIC ACID 1 MG TABS: 1 | 30 days supply | Qty: 30 | Fill #0

## 2019-05-02 MED FILL — traMADol HCL 50 MG TABS: 50 | 10 days supply | Qty: 30 | Fill #0

## 2019-05-02 MED FILL — ONDANSETRON HCL 4 MG TABLET: 4 | 5 days supply | Qty: 20 | Fill #0

## 2019-05-02 NOTE — Progress Notes (Signed)
ANTICOAGULATION CONSULT NOTE  Pharmacy Consult:   Eliquis Indication: pulmonary embolus  No Known Allergies  Patient Measurements: Height: 6' (182.9 cm) Weight: 208 lb 15.9 oz (94.8 kg) IBW/kg (Calculated) : 77.6  Vital Signs: Temp: 98.7 F (37.1 C) (03/05 0800) Temp Source: Oral (03/05 0800) BP: 134/94 (03/05 0800) Pulse Rate: 74 (03/05 0800)  Labs: Recent Labs    04/29/19 1457 04/30/19 0259 04/30/19 0916 04/30/19 2024 04/30/19 2218 05/01/19 0020 05/02/19 0237  HGB  --  13.3  --   --   --  12.0*  --   HCT  --  38.5*  --   --   --  35.7*  --   PLT  --  240  --   --   --  227  --   HEPARINUNFRC 0.44  --  0.44  --   --  0.51  --   CREATININE  --   --  1.07  --   --  1.01 1.06  TROPONINIHS  --   --   --  23* 21* 19*  --     Estimated Creatinine Clearance: 96.3 mL/min (by C-G formula based on SCr of 1.06 mg/dL).   Assessment: Pt was seen in the ED the day prior and left AMA. He presented again with bilateral PE and also noted with LLE DVT.  He on Eliquis.   Goal of Therapy:  Appropriate anticoagulation Monitor platelets by anticoagulation protocol: Yes   Plan:  -For ease of administration, will adjust to Eliquis 10mg  PO BID then start 5mg  PO  BID on 3/12 -Will sign off. Please contact pharmacy with any other needs.  Thank you Hildred Laser, PharmD Clinical Pharmacist **Pharmacist phone directory can now be found on [email or web address] ([email or web address]).  Listed under [place].

## 2019-05-02 NOTE — Progress Notes (Addendum)
Pt provided discharge instructions and education. Pt IV removed and intact CCMD notified/telebox removed. Pt vitals stable. Pt denies complaints. Pt has all belongings. TOC to deliver meds.  Pt tx via wheelchair via volunteers to meet ride.  Jerald Kief, RN

## 2019-05-02 NOTE — Discharge Summary (Addendum)
Physician Discharge Summary  [name] IRJ:[id number] DOB: June 21, 1965 DOA: 04/28/2019  PCP: [facility]  Admit date: 04/28/2019 Discharge date: 05/02/2019  Time spent: 30 minutes  Recommendations for Outpatient Follow-up:   Bilateral pulmonary emboli extensive/Positive LEFT lower extremity DVT -Continue IV heparin for total of 48 to 72 hours -See goals of care; will need to start on apixaban versus Xarelto dependent on cost -Echocardiogram; EF 55 to 30%, diastolic CHF see results below -3/3 lower extremity Doppler; positive acute DVT, see results below -Patient ambulated [facility] hallway greater than 650 ft unassisted. SpO2 monitored throughout. Lowest reading was 96%, average was 98%. HR steady during walk at 113 bpm. Pt denied shortness of breath, was able to carry on conversation, and denied any chest pain. -Does not meet criteria for home O2 -3/4 Apixaban [facility] pharmacy -Patient will establish care with [facility]  Elevated liver enzyme/Hepatic Steatosis -Most likely secondary EtOH abuse -Liver ultrasound; consistent with hepatocellular disease see results below  Mild elevated troponins/Demand ischemia -Probably from right heart strain from bilateral PE -Continue to monitor. Results for Luis [facility], Gilbert (MRN [id number]) as of 05/02/2019 10:30  Ref. Range 04/28/2019 18:57 04/28/2019 20:19 04/30/2019 20:24 04/30/2019 22:18 05/01/2019 00:20  Troponin I (High Sensitivity) Latest Ref Range: <18 ng/L 74 (H) 84 (H) 23 (H) 21 (H) 19 (H)  -Troponins continue to trend down  EtOH intoxication -On admission EtOH level= 164 -Patient counseled extensively on need to discontinue use of alcohol as well as marijuana given his new PE and DVT.  Polysubstance abuse -3/3 urine tox screen positive marijuana  Hypokalemia -Potassium goal> 4  Hypomagnesmia -Magnesium goal> 2 -Magnesium oxide 800 mg prior to discharge   Discharge Diagnoses:  Principal Problem:   Pulmonary  embolism ([facility]) Active Problems:   Alcoholic intoxication without complication (HCC)   Elevated troponin   Pulmonary emboli (HCC)   Acute deep vein thrombosis (DVT) of left lower extremity (HCC)   Hepatic steatosis   Alcohol abuse with intoxication ([facility])   Polysubstance abuse ([facility])   Hypokalemia   Discharge Condition: Stable  Diet recommendation: Lower  Filed Weights   04/29/19 0330 05/01/19 0515 05/02/19 0420  Weight: 90.4 kg 93.4 kg 94.8 kg    History of present illness:  54 year old BM PMHx EtOH abuse, Polysubstance abuse presents with left-sided chest pain on 04/27/2019. He signed out [place] before being admitted for further evaluation.   He presents again yesterday for similar complaints and was found to have extensive pulmonary emboli in all the lobes. He was started on IV heparin and referred to [facility] for admission.Patient is currently on room air with oxygen sats greater than 95% and reports his chest pain is improving.  Hospital Course:  Diagnosed with PE/DVT with right heart strain.  Placed on appropriate anticoagulant.  In addition counseled on need for abstinence from EtOH and illegal substances.  Stable for discharge.    Procedures: 3/1 CTA chest PE protocol:-Extensive distal main, lobar, and segmental pulmonary emboli in all lobes, Overall clot burden is moderate to large. -Elevated RV to LV ratio, raising concern for right heart strain. 3/2 Echocardiogram; left ventricular ejection fraction of 55 to 60%, no regional wall abnormalities, left ventricular diastolic parameters are consistent with grade 1 diastolic dysfunction. Right ventricular [facility] wall is mildly hypokinetic, apex contracts normally, consistent with acute PE right ventricular systolic function is mildly reduced. 3/2 Ultrasound abdomen;Diffusely echogenic liver consistent with hepatic steatosis and or hepatocellular disease. 2. Negative for gallstones 3/3 lower extremity  Doppler; positive acute DVT  left popliteal vein, left posterior tibial veins, left peroneal veins, and left gastrocnemius veins.      Discharge Exam: Vitals:   05/02/19 0004 05/02/19 0415 05/02/19 0420 05/02/19 0800  BP: (!) 140/107 (!) 139/108  (!) 134/94  Pulse: 94   74  Resp: 17 16  11   Temp: 98.4 F (36.9 C) 97.9 F (36.6 C)  98.7 F (37.1 C)  TempSrc: Oral Oral  Oral  SpO2: 98% 95%  98%  Weight:   94.8 kg   Height:        General: A/O x4, no acute respiratory distress Eyes: negative scleral hemorrhage, negative anisocoria, negative icterus ENT: Negative Runny nose, negative gingival bleeding, Neck:  Negative scars, masses, torticollis, lymphadenopathy, JVD Lungs: Clear to auscultation bilaterally without wheezes or crackles Cardiovascular: Regular rate and rhythm without murmur gallop or rub normal S1 and S2   Discharge Instructions   Allergies as of 05/02/2019   No Known Allergies     Medication List    TAKE these medications   apixaban 5 MG Tabs tablet Commonly known as: ELIQUIS Take 2 tablets (10 mg total) by mouth 2 (two) times daily.   apixaban 5 MG Tabs tablet Commonly known as: ELIQUIS Take 1 tablet (5 mg total) by mouth 2 (two) times daily. Start taking on: May 09, 2019   bisacodyl 10 MG suppository Commonly known as: DULCOLAX Place 1 suppository (10 mg total) rectally daily as needed for moderate constipation.   folic acid 1 MG tablet Commonly known as: FOLVITE Take 1 tablet (1 mg total) by mouth daily. Start taking on: May 03, 2019   multivitamin with minerals Tabs tablet Take 1 tablet by mouth daily. Start taking on: May 03, 2019   nicotine 21 mg/24hr patch Commonly known as: NICODERM CQ - dosed in mg/24 hours Place 1 patch (21 mg total) onto the skin daily. Start taking on: May 03, 2019   ondansetron 4 MG tablet Commonly known as: ZOFRAN Take 1 tablet (4 mg total) by mouth every 6 (six) hours as needed for nausea.   thiamine 100 MG tablet Take 1 tablet  (100 mg total) by mouth daily. Start taking on: May 03, 2019   traMADol 50 MG tablet Commonly known as: ULTRAM Take 1 tablet (50 mg total) by mouth every 8 (eight) hours as needed for moderate pain.      No Known Allergies Follow-up Information    [facility] Follow up.   Why: Please contact [facility] to place a screening so that you can be an established patient with the [facility] - Call [phone] - they will confirm appointment with the [facility] clinic. Contact information: [place] [phone]           The results of significant diagnostics from this hospitalization (including imaging, microbiology, ancillary and laboratory) are listed below for reference.    Significant Diagnostic Studies: CT Angio Chest PE W and/or Wo Contrast  Result Date: 04/28/2019 CLINICAL DATA:  Shortness of breath, chest pain x1 week, elevated D-dimer EXAM: CT ANGIOGRAPHY CHEST WITH CONTRAST TECHNIQUE: Multidetector CT imaging of the chest was performed using the standard protocol during bolus administration of intravenous contrast. Multiplanar CT image reconstructions and MIPs were obtained to evaluate the vascular anatomy. CONTRAST:  162mL OMNIPAQUE IOHEXOL 350 MG/ML SOLN COMPARISON:  Chest radiograph dated 04/27/2019 FINDINGS: Cardiovascular: Satisfactory opacification the bilateral pulmonary arteries to the segmental  level. Extensive distal main, lobar, and segmental pulmonary emboli in all lobes. Overall clot burden is moderate to large. Elevated RV to LV ratio (1.46), raising concern for right heart strain. Mild enlargement of the main pulmonary artery, suggesting pulmonary arterial hypertension. Although not tailored for evaluation of the thoracic aorta, there is no evidence of thoracic aortic aneurysm or dissection. Mild atherosclerotic calcifications of the aortic arch. The heart is normal in size.  No pericardial  effusion. Mild coronary atherosclerosis the LAD. Mediastinum/Nodes: No suspicious mediastinal lymphadenopathy. Visualized thyroid is unremarkable. Lungs/Pleura: Lungs are clear. No suspicious pulmonary nodules. No focal consolidation or findings suspicious for developing pulmonary infarct. No pleural effusion or pneumothorax. Upper Abdomen: Visualized upper abdomen is notable for severe hepatic steatosis and a 2.8 cm lateral left upper pole renal cyst. Musculoskeletal: Mild degenerative changes of the visualized thoracolumbar spine. Review of the MIP images confirms the above findings. IMPRESSION: Extensive distal main, lobar, and segmental pulmonary emboli in all lobes, as described above. Overall clot burden is moderate to large. Elevated RV to LV ratio, raising concern for right heart strain. Critical Value/emergent results were called by telephone at the time of interpretation on 04/28/2019 at 8:22 pm to provider Carmin Muskrat , who verbally acknowledged these results. Aortic Atherosclerosis (ICD10-I70.0). Electronically Signed   By: Julian Hy M.D.   On: 04/28/2019 20:27   US Abdomen Limited  Result Date: 04/29/2019 CLINICAL DATA:  Elevated LFT EXAM: ULTRASOUND ABDOMEN LIMITED RIGHT UPPER QUADRANT COMPARISON:  CT 07/16/1998 FINDINGS: Gallbladder: No gallstones or wall thickening visualized. No sonographic Murphy sign noted by sonographer. Common bile duct: Diameter: 4.7 mm Liver: Echogenic liver. No focal hepatic abnormality. Portal vein is patent on color Doppler imaging with normal direction of blood flow towards the liver. Other: None. IMPRESSION: 1. Diffusely echogenic liver consistent with hepatic steatosis and or hepatocellular disease. 2. Negative for gallstones Electronically Signed   By: Donavan Foil M.D.   On: 04/29/2019 22:00   DG Chest Port 1 View  Result Date: 04/27/2019 CLINICAL DATA:  Chest pain EXAM: PORTABLE CHEST 1 VIEW COMPARISON:  Jul 17, 2006 FINDINGS: The heart size and  mediastinal contours are borderline enlarged. There is prominence of the central pulmonary vasculature. No large airspace consolidation or pleural effusion. Right posterior healed rib fractures are seen. IMPRESSION: Prominence of the central pulmonary vasculature. Electronically Signed   By: Prudencio Pair M.D.   On: 04/27/2019 22:02   ECHOCARDIOGRAM COMPLETE  Result Date: 04/29/2019    ECHOCARDIOGRAM REPORT   Patient Name:   OWAIS PRUETT Date of Exam: 04/29/2019 Medical Rec #:  [id number]     Height:       72.0 in Accession #:    [id number]    Weight:       199.3 lb Date of Birth:  11-Mar-1965     BSA:          2.127 m Patient Age:    76 years      BP:           140/96 mmHg Patient Gender: M             HR:           91 bpm. Exam Location:  Inpatient Procedure: 2D Echo, Cardiac Doppler and Color Doppler Indications:    Pulmonary embolus  History:        Patient has no prior history of Echocardiogram examinations.  Arrythmias:abnormal EKG, Signs/Symptoms:Chest Pain; Risk                 Factors:Current Smoker. Pulmonary embolus, elevated Troponin,                 polysubstance abuse,.  Sonographer:    Dustin Flock Referring Phys: [id number] [place]  Sonographer Comments: Image acquisition challenging due to COPD. IMPRESSIONS  1. Left ventricular ejection fraction, by estimation, is 55 to 60%. The left ventricle has normal function. The left ventricle has no regional wall motion abnormalities. There is mild concentric left ventricular hypertrophy. Left ventricular diastolic parameters are consistent with Grade I diastolic dysfunction (impaired relaxation).  2. The right ventricular [facility] wall is mildly hypokinetic, but the apex contracts normally (McConnell's sign), consistent with acute pulmonary embolism. Right ventricular systolic function is mildly reduced. The right ventricular size is mildly enlarged.  There is mildly elevated pulmonary artery systolic pressure. The estimated right  ventricular systolic pressure is 95.2 mmHg.  3. Right atrial size was mildly dilated.  4. The mitral valve is normal in structure and function. No evidence of mitral valve regurgitation.  5. The aortic valve is normal in structure and function. Aortic valve regurgitation is not visualized.  6. The inferior vena cava is normal in size with greater than 50% respiratory variability, suggesting right atrial pressure of 3 mmHg. Comparison(s): No prior Echocardiogram. FINDINGS  Left Ventricle: Left ventricular ejection fraction, by estimation, is 55 to 60%. The left ventricle has normal function. The left ventricle has no regional wall motion abnormalities. The left ventricular internal cavity size was normal in size. There is  mild concentric left ventricular hypertrophy. Left ventricular diastolic parameters are consistent with Grade I diastolic dysfunction (impaired relaxation). Normal left ventricular filling pressure. Right Ventricle: The right ventricular [facility] wall is mildly hypokinetic, but the apex contracts normally (McConnell's sign), consistent with acute pulmonary embolism. The right ventricular size is mildly enlarged. Right vetricular wall thickness was not assessed. Right ventricular systolic function is mildly reduced. There is mildly elevated pulmonary artery systolic pressure. The tricuspid regurgitant velocity is 2.65 m/s, and with an assumed right atrial pressure of 8 mmHg, the estimated right ventricular systolic pressure is 84.1 mmHg. Left Atrium: Left atrial size was normal in size. Right Atrium: Right atrial size was mildly dilated. Pericardium: There is no evidence of pericardial effusion. Mitral Valve: The mitral valve is normal in structure and function. No evidence of mitral valve regurgitation. Tricuspid Valve: The tricuspid valve is grossly normal. Tricuspid valve regurgitation is not demonstrated. Aortic Valve: The aortic valve is normal in structure and function. Aortic valve regurgitation  is not visualized. Pulmonic Valve: The pulmonic valve was not well visualized. Pulmonic valve regurgitation is not visualized. Aorta: The aortic root is normal in size and structure. Venous: The inferior vena cava is normal in size with greater than 50% respiratory variability, suggesting right atrial pressure of 3 mmHg. IAS/Shunts: No atrial level shunt detected by color flow Doppler.  LEFT VENTRICLE PLAX 2D LVIDd:         5.00 cm  Diastology LVIDs:         3.70 cm  LV e' lateral:   6.53 cm/s LV PW:         1.30 cm  LV E/e' lateral: 7.2 LV IVS:        1.20 cm  LV e' medial:    4.79 cm/s LVOT diam:     2.20 cm  LV E/e' medial:  9.8  LV SV:         37 LV SV Index:   17 LVOT Area:     3.80 cm  RIGHT VENTRICLE RV Basal diam:  3.00 cm RV S prime:     7.07 cm/s TAPSE (M-mode): 3.2 cm LEFT ATRIUM             Index       RIGHT ATRIUM           Index LA diam:        3.10 cm 1.46 cm/m  RA Area:     16.60 cm LA Vol (A2C):   43.5 ml 20.45 ml/m RA Volume:   44.10 ml  20.73 ml/m LA Vol (A4C):   56.1 ml 26.37 ml/m LA Biplane Vol: 50.4 ml 23.69 ml/m  AORTIC VALVE LVOT Vmax:   65.90 cm/s LVOT Vmean:  45.600 cm/s LVOT VTI:    0.097 m  AORTA Ao Root diam: 3.30 cm MITRAL VALVE               TRICUSPID VALVE MV Area (PHT): 12.04 cm   TR Peak grad:   28.1 mmHg MV Decel Time: 63 msec     TR Vmax:        265.00 cm/s MV E velocity: 47.10 cm/s MV A velocity: 73.70 cm/s  SHUNTS MV E/A ratio:  0.64        Systemic VTI:  0.10 m                            Systemic Diam: 2.20 cm Dani Gobble Croitoru MD Electronically signed by Sanda Klein MD Signature Date/Time: 04/29/2019/11:14:13 AM    Final    VAS [place] LOWER EXTREMITY VENOUS (DVT)  Result Date: 04/30/2019  Lower Venous DVTStudy Indications: Pulmonary embolism.  Risk Factors: Confirmed PE. Anticoagulation: Heparin. Comparison Study: No prior studies. Performing Technologist: Oliver Hum RVT  Examination Guidelines: A complete evaluation includes B-mode imaging, spectral Doppler, color  Doppler, and power Doppler as needed of all accessible portions of each vessel. Bilateral testing is considered an integral part of a complete examination. Limited examinations for reoccurring indications may be performed as noted. The reflux portion of the exam is performed with the patient in reverse Trendelenburg.  +---------+---------------+---------+-----------+----------+--------------+ RIGHT    CompressibilityPhasicitySpontaneityPropertiesThrombus Aging +---------+---------------+---------+-----------+----------+--------------+ CFV      Full           Yes      Yes                                 +---------+---------------+---------+-----------+----------+--------------+ SFJ      Full                                                        +---------+---------------+---------+-----------+----------+--------------+ FV Prox  Full                                                        +---------+---------------+---------+-----------+----------+--------------+ FV Mid   Full                                                        +---------+---------------+---------+-----------+----------+--------------+  FV DistalFull                                                        +---------+---------------+---------+-----------+----------+--------------+ PFV      Full                                                        +---------+---------------+---------+-----------+----------+--------------+ POP      Full           Yes      Yes                                 +---------+---------------+---------+-----------+----------+--------------+ PTV      Full                                                        +---------+---------------+---------+-----------+----------+--------------+ PERO     Full                                                        +---------+---------------+---------+-----------+----------+--------------+    +---------+---------------+---------+-----------+----------+--------------+ LEFT     CompressibilityPhasicitySpontaneityPropertiesThrombus Aging +---------+---------------+---------+-----------+----------+--------------+ CFV      Full           Yes      Yes                                 +---------+---------------+---------+-----------+----------+--------------+ SFJ      Full                                                        +---------+---------------+---------+-----------+----------+--------------+ FV Prox  Full                                                        +---------+---------------+---------+-----------+----------+--------------+ FV Mid   Full                                                        +---------+---------------+---------+-----------+----------+--------------+ FV DistalFull                                                        +---------+---------------+---------+-----------+----------+--------------+  PFV      Full                                                        +---------+---------------+---------+-----------+----------+--------------+ POP      None           No       No                   Acute          +---------+---------------+---------+-----------+----------+--------------+ PTV      None                                         Acute          +---------+---------------+---------+-----------+----------+--------------+ PERO     Partial                                      Acute          +---------+---------------+---------+-----------+----------+--------------+ Gastroc  Partial                                      Acute          +---------+---------------+---------+-----------+----------+--------------+     Summary: RIGHT: - There is no evidence of deep vein thrombosis in the lower extremity.  - No cystic structure found in the popliteal fossa.  LEFT: - Findings consistent with acute deep vein  thrombosis involving the left popliteal vein, left posterior tibial veins, left peroneal veins, and left gastrocnemius veins. - No cystic structure found in the popliteal fossa.  *See table(s) above for measurements and observations. Electronically signed by Harold Barban MD on 04/30/2019 at 8:56:53 PM.    Final     Microbiology: Recent Results (from the past 240 hour(s))  SARS CORONAVIRUS 2 (TAT 6-24 HRS) Nasopharyngeal Nasopharyngeal Swab     Status: None   Collection Time: 04/28/19 10:38 PM   Specimen: Nasopharyngeal Swab  Result Value Ref Range Status   SARS Coronavirus 2 NEGATIVE NEGATIVE Final    Comment: (NOTE) SARS-CoV-2 target nucleic acids are NOT DETECTED. The SARS-CoV-2 RNA is generally detectable in upper and lower respiratory specimens during the acute phase of infection. Negative results do not preclude SARS-CoV-2 infection, do not rule out co-infections with other pathogens, and should not be used as the sole basis for treatment or other patient management decisions. Negative results must be combined with clinical observations, patient history, and epidemiological information. The expected result is Negative. Fact Sheet for Patients: SugarRoll.be Fact Sheet for Healthcare Providers: https://www.Blaize Epple-mathews.com/ This test is not yet approved or cleared by the [place] FDA and  has been authorized for detection and/or diagnosis of SARS-CoV-2 by FDA under an Emergency Use Authorization (EUA). This EUA will remain  in effect (meaning this test can be used) for the duration of the COVID-19 declaration under Section 56 4(b)(1) of the Act, 21 U.S.C. section 360bbb-3(b)(1), unless the authorization is terminated or revoked sooner. Performed at [facility], [place] [address], [place], [place] [address]  Labs: Basic Metabolic Panel: Recent Labs  Lab 04/27/19 2130 04/28/19 1857 04/30/19 0916 05/01/19 0020  05/02/19 0237  NA 136 132* 136 137 136  K 4.1 4.0 3.3* 3.9 3.7  CL 102 99 99 105 103  CO2 19* 17* 24 22 22   GLUCOSE 83 79 142* 111* 98  BUN 11 12 13 13 10   CREATININE 0.86 0.94 1.07 1.01 1.06  CALCIUM 8.6* 8.4* 8.6* 8.6* 8.5*  MG  --   --  1.8 1.8 1.6*  PHOS  --   --  3.6 4.2 5.3*   Liver Function Tests: Recent Labs  Lab 04/27/19 2130 04/28/19 1857 04/30/19 0916 05/01/19 0020 05/02/19 0237  AST 127* 115* 53* 53* 51*  ALT 170* 179* 101* 90* 86*  ALKPHOS 62 64 58 70 61  BILITOT 0.9 0.8 1.3* 0.6 0.8  PROT 8.0 8.0 6.8 6.4* 6.2*  ALBUMIN 3.8 3.7 3.0* 3.0* 2.9*   No results for input(s): LIPASE, AMYLASE in the last 168 hours. No results for input(s): AMMONIA in the last 168 hours. CBC: Recent Labs  Lab 04/27/19 2130 04/28/19 1857 04/29/19 0720 04/30/19 0259 05/01/19 0020  WBC 5.4 5.5 5.2 5.8 5.9  NEUTROABS 2.8 2.9  --   --   --   HGB 13.8 13.7 14.2 13.3 12.0*  HCT 41.0 40.2 41.4 38.5* 35.7*  MCV 100.7* 99.8 98.6 97.7 100.6*  PLT 217 246 245 240 227   Cardiac Enzymes: No results for input(s): CKTOTAL, CKMB, CKMBINDEX, TROPONINI in the last 168 hours. BNP: BNP (last 3 results) No results for input(s): BNP in the last 8760 hours.  ProBNP (last 3 results) No results for input(s): PROBNP in the last 8760 hours.  CBG: No results for input(s): GLUCAP in the last 168 hours.     Signed:  Dia Crawford, MD [facility] [phone] pager

## 2019-06-25 MED FILL — ELIQUIS 5 MG TABLET: 5 | 30 days supply | Qty: 60 | Fill #0

## 2020-01-26 ENCOUNTER — Other Ambulatory Visit: Payer: Self-pay

## 2020-01-26 ENCOUNTER — Emergency Department (HOSPITAL_COMMUNITY)
Admission: EM | Admit: 2020-01-26 | Discharge: 2020-01-26 | Disposition: A | Discharge status: Home / Self Care | Payer: Self-pay | Attending: Emergency Medicine | Admitting: Emergency Medicine

## 2020-01-26 ENCOUNTER — Encounter (HOSPITAL_COMMUNITY): Payer: Self-pay

## 2020-01-26 DIAGNOSIS — I10 Essential (primary) hypertension: Secondary | ICD-10-CM | POA: Insufficient documentation

## 2020-01-26 DIAGNOSIS — Z86711 Personal history of pulmonary embolism: Secondary | ICD-10-CM | POA: Insufficient documentation

## 2020-01-26 DIAGNOSIS — Z20822 Contact with and (suspected) exposure to covid-19: Secondary | ICD-10-CM | POA: Insufficient documentation

## 2020-01-26 DIAGNOSIS — Z7901 Long term (current) use of anticoagulants: Secondary | ICD-10-CM | POA: Insufficient documentation

## 2020-01-26 DIAGNOSIS — Z86718 Personal history of other venous thrombosis and embolism: Secondary | ICD-10-CM | POA: Insufficient documentation

## 2020-01-26 DIAGNOSIS — R519 Headache, unspecified: Secondary | ICD-10-CM | POA: Insufficient documentation

## 2020-01-26 DIAGNOSIS — F17210 Nicotine dependence, cigarettes, uncomplicated: Secondary | ICD-10-CM | POA: Insufficient documentation

## 2020-01-26 DIAGNOSIS — Z1152 Encounter for screening for COVID-19: Secondary | ICD-10-CM

## 2020-01-26 HISTORY — DX: Acute myocardial infarction, unspecified: I21.9

## 2020-01-26 HISTORY — DX: Essential (primary) hypertension: I10

## 2020-01-26 LAB — RESP PANEL BY RT-PCR (FLU A&B, COVID) ARPGX2
Influenza A by PCR: NEGATIVE
Influenza B by PCR: NEGATIVE
SARS Coronavirus 2 by RT PCR: NEGATIVE

## 2020-01-26 NOTE — Discharge Instructions (Addendum)
Please come back to the ER with any new or worsening symptoms.  It was a pleasure to meet you.

## 2020-01-26 NOTE — ED Provider Notes (Signed)
[facility] Provider Note   CSN: [id number] Arrival date & time: 01/26/20  1402     History Chief Complaint  Patient presents with  . Headache  . Covid Exposure    Luis Gilbert is a 54 y.o. male.  HPI   Patient is a 54 year old male with a medical history as noted below.  Patient states he has been experiencing a waxing and waning mild headache for the past couple of days.  Also notes a mild dry cough.  Patient smokes 1/2 pack/day.  Patient states his wife was recently admitted to the hospital for pancreatitis and was noted to be positive for COVID-19.  He presents today for COVID-19 testing.  No chest pain, shortness of breath, abdominal pain, vomiting, syncope, dizziness, numbness, weakness, visual changes.     Past Medical History:  Diagnosis Date  . Heart attack ([place])   . Hypertension   . PE (pulmonary thromboembolism) ([place]) 04/2019    Patient Active Problem List   Diagnosis Date Noted  . Acute deep vein thrombosis (DVT) of left lower extremity ([place]) 05/01/2019  . Hepatic steatosis 05/01/2019  . Alcohol abuse with intoxication ([place]) 05/01/2019  . Polysubstance abuse ([place]) 05/01/2019  . Hypokalemia 05/01/2019  . Pulmonary embolism ([place]) 04/28/2019  . Alcoholic intoxication without complication ([place]) 01/60/1093  . Elevated troponin 04/28/2019  . Pulmonary emboli ([place]) 04/28/2019    Past Surgical History:  Procedure Laterality Date  . ARM SURGERY         History reviewed. No pertinent family history.  Social History   Tobacco Use  . Smoking status: Current Every Day Smoker    Packs/day: 1.00    Types: Cigarettes  . Smokeless tobacco: Never Used  Vaping Use  . Vaping Use: Never used  Substance Use Topics  . Alcohol use: Yes  . Drug use: Yes    Types: Marijuana    Home Medications Prior to Admission medications   Medication Sig Start Date End Date Taking? Authorizing Provider  apixaban (ELIQUIS) 5 MG TABS tablet Take 1 tablet  (5 mg total) by mouth 2 (two) times daily. Start after completing the Eliquis Starter Pack 05/09/19   Allie Bossier, MD  Apixaban Starter Pack [facility] DVT/PE STARTER PACK) 5 MG TBPK Take as directed on package: start with two-5mg  tablets twice daily for 7 days. On day 8, switch to one-5mg  tablet twice daily. 05/02/19   Allie Bossier, MD  bisacodyl (DULCOLAX) 5 MG EC tablet Take 1 tablet (5 mg total) by mouth daily as needed for moderate constipation. 05/02/19 05/01/20  Allie Bossier, MD  folic acid (FOLVITE) 1 MG tablet Take 1 tablet (1 mg total) by mouth daily. 05/03/19   Allie Bossier, MD  Multiple Vitamin (MULTIVITAMIN WITH MINERALS) TABS tablet Take 1 tablet by mouth daily. 05/03/19   Allie Bossier, MD  nicotine (NICODERM CQ - DOSED IN MG/24 HOURS) 21 mg/24hr patch Place 1 patch (21 mg total) onto the skin daily. 05/03/19   Allie Bossier, MD  ondansetron (ZOFRAN) 4 MG tablet Take 1 tablet (4 mg total) by mouth every 6 (six) hours as needed for nausea. 05/02/19   Allie Bossier, MD  thiamine 100 MG tablet Take 1 tablet (100 mg total) by mouth daily. 05/03/19   Allie Bossier, MD  traMADol (ULTRAM) 50 MG tablet Take 1 tablet (50 mg total) by mouth every 8 (eight) hours as needed for moderate pain. 05/02/19   Allie Bossier, MD  Allergies    Patient has no known allergies.  Review of Systems   Review of Systems  Constitutional: Negative for chills and fever.  HENT: Negative for congestion, rhinorrhea and sore throat.   Respiratory: Positive for cough. Negative for shortness of breath and wheezing.   Cardiovascular: Negative for chest pain and leg swelling.  Gastrointestinal: Negative for abdominal pain, nausea and vomiting.  Neurological: Positive for headaches. Negative for weakness and numbness.   Physical Exam Updated Vital Signs BP (!) 164/114 (BP Location: Right Arm)   Pulse 95   Temp 97.7 F (36.5 C) (Oral)   Resp 18   Ht 5\' 11"  (1.803 m)   Wt 103 kg   SpO2 98%   BMI 31.66  kg/m   Physical Exam Vitals and nursing note reviewed.  Constitutional:      General: He is not in acute distress.    Appearance: Normal appearance. He is well-developed. He is not ill-appearing, toxic-appearing or diaphoretic.  HENT:     Head: Normocephalic and atraumatic.     Right Ear: External ear normal.     Left Ear: External ear normal.     Nose: Nose normal.     Mouth/Throat:     Mouth: Mucous membranes are moist.     Pharynx: Oropharynx is clear. No oropharyngeal exudate or posterior oropharyngeal erythema.  Eyes:     General: No scleral icterus.    Extraocular Movements: Extraocular movements intact.     Right eye: Normal extraocular motion and no nystagmus.     Left eye: Normal extraocular motion and no nystagmus.     Pupils: Pupils are equal, round, and reactive to light. Pupils are equal.     Right eye: Pupil is round and reactive.     Left eye: Pupil is round and reactive.  Cardiovascular:     Rate and Rhythm: Normal rate and regular rhythm.     Pulses: Normal pulses.     Heart sounds: Normal heart sounds. No murmur heard.  No friction rub. No gallop.   Pulmonary:     Effort: Pulmonary effort is normal. No respiratory distress.     Breath sounds: Normal breath sounds. No stridor. No wheezing, rhonchi or rales.     Comments: LCTAB Abdominal:     General: Abdomen is flat. There is no distension.     Palpations: Abdomen is soft.  Musculoskeletal:        General: Normal range of motion.     Cervical back: Normal range of motion and neck supple. No tenderness.  Skin:    General: Skin is warm and dry.  Neurological:     General: No focal deficit present.     Mental Status: He is alert and oriented to person, place, and time.     GCS: GCS eye subscore is 4. GCS verbal subscore is 5. GCS motor subscore is 6.     Comments: Patient is oriented to person, place, and time. Patient phonates in clear, complete, and coherent sentences. Negative arm drift. Strength is 5/5  in all four extremities. Distal sensation intact in all four extremities.   Psychiatric:        Mood and Affect: Mood normal.        Behavior: Behavior normal.    ED Results / Procedures / Treatments   Labs (all labs ordered are listed, but only abnormal results are displayed) Labs Reviewed  RESP PANEL BY RT-PCR (FLU A&B, COVID) [id number]    EKG None  Radiology  No results found.  Procedures Procedures (including critical care time)  Medications Ordered in ED Medications - No data to display  ED Course  I have reviewed the triage vital signs and the nursing notes.  Pertinent labs & imaging results that were available during my care of the patient were reviewed by me and considered in my medical decision making (see chart for details).    MDM Rules/Calculators/A&P                          Patient is a 54 year old male who presents to the emergency department for a mild headache as well as a mild dry cough.  He is a smoker.  His wife recently tested positive for COVID-19 so he was concerned regarding his COVID-19 status as well.  He is not immunized.  His test today was negative.  His physical exam is also reassuring.  Neurological exam is benign.  I did note that his blood pressure was elevated and discussed this with the patient in length.  Denies any chest pain, shortness of breath, numbness, or weakness.  States that he has been compliant with his regular medications, including Eliquis.  He is going to follow-up with his PCP regarding his high blood pressure.  Patient given strict return precautions that he knows to return to the ER with any new or worsening symptoms.  We discussed return precautions he verbalized understanding.  His questions were answered and he was amicable at the time of discharge.  Final Clinical Impression(s) / ED Diagnoses Final diagnoses:  Encounter for screening for COVID-19   Rx / DC Orders ED Discharge Orders    None       Rayna Sexton,  PA-C 01/26/20 1659    Truddie Hidden, MD 01/26/20 [id number]

## 2020-01-26 NOTE — ED Triage Notes (Signed)
Pt to er, pt states that he has had a headache since yesterday, states that his wife was seen yesterday and was found to be covid positive, states that he is here to be tested for covid.  Reports slight cough, denies fever.  Denies vaccination.

## 2020-09-28 ENCOUNTER — Emergency Department (HOSPITAL_COMMUNITY)
Admission: EM | Admit: 2020-09-28 | Discharge: 2020-09-28 | Disposition: A | Discharge status: Home / Self Care | Payer: Self-pay | Attending: Emergency Medicine | Admitting: Emergency Medicine

## 2020-09-28 ENCOUNTER — Other Ambulatory Visit: Payer: Self-pay

## 2020-09-28 ENCOUNTER — Emergency Department (HOSPITAL_COMMUNITY): Payer: Self-pay

## 2020-09-28 ENCOUNTER — Encounter (HOSPITAL_COMMUNITY): Payer: Self-pay | Admitting: *Deleted

## 2020-09-28 DIAGNOSIS — M4802 Spinal stenosis, cervical region: Secondary | ICD-10-CM | POA: Insufficient documentation

## 2020-09-28 DIAGNOSIS — F17210 Nicotine dependence, cigarettes, uncomplicated: Secondary | ICD-10-CM | POA: Insufficient documentation

## 2020-09-28 DIAGNOSIS — I10 Essential (primary) hypertension: Secondary | ICD-10-CM | POA: Insufficient documentation

## 2020-09-28 DIAGNOSIS — Z7901 Long term (current) use of anticoagulants: Secondary | ICD-10-CM | POA: Insufficient documentation

## 2020-09-28 LAB — CBC WITH DIFFERENTIAL/PLATELET
Abs Immature Granulocytes: 0.02 10*3/uL (ref 0.00–0.07)
Basophils Absolute: 0.1 10*3/uL (ref 0.0–0.1)
Basophils Relative: 1 %
Eosinophils Absolute: 0.1 10*3/uL (ref 0.0–0.5)
Eosinophils Relative: 1 %
HCT: 45.5 % (ref 39.0–52.0)
Hemoglobin: 15.8 g/dL (ref 13.0–17.0)
Immature Granulocytes: 0 %
Lymphocytes Relative: 37 %
Lymphs Abs: 1.8 10*3/uL (ref 0.7–4.0)
MCH: 34.1 pg — ABNORMAL HIGH (ref 26.0–34.0)
MCHC: 34.7 g/dL (ref 30.0–36.0)
MCV: 98.3 fL (ref 80.0–100.0)
Monocytes Absolute: 0.5 10*3/uL (ref 0.1–1.0)
Monocytes Relative: 10 %
Neutro Abs: 2.5 10*3/uL (ref 1.7–7.7)
Neutrophils Relative %: 51 %
Platelets: 242 10*3/uL (ref 150–400)
RBC: 4.63 MIL/uL (ref 4.22–5.81)
RDW: 12 % (ref 11.5–15.5)
WBC: 4.9 10*3/uL (ref 4.0–10.5)
nRBC: 0 % (ref 0.0–0.2)

## 2020-09-28 LAB — BASIC METABOLIC PANEL
Anion gap: 13 (ref 5–15)
BUN: 11 mg/dL (ref 6–20)
CO2: 18 mmol/L — ABNORMAL LOW (ref 22–32)
Calcium: 8.8 mg/dL — ABNORMAL LOW (ref 8.9–10.3)
Chloride: 100 mmol/L (ref 98–111)
Creatinine, Ser: 0.95 mg/dL (ref 0.61–1.24)
GFR, Estimated: >60 mL/min (ref >60)
Glucose, Bld: 96 mg/dL (ref 70–99)
Potassium: 4 mmol/L (ref 3.5–5.1)
Sodium: 131 mmol/L — ABNORMAL LOW (ref 135–145)

## 2020-09-28 MED ORDER — LIDOCAINE 5 % EX PTCH: 1.0000 | MEDICATED_PATCH | CUTANEOUS | 0 refills | Status: DC

## 2020-09-28 MED ORDER — LORAZEPAM 2 MG/ML IJ SOLN
0.5000 mg | Freq: Once | INTRAMUSCULAR | Status: AC
  Administered 2020-09-28: 0.5 mg via INTRAVENOUS
  Filled 2020-09-28: qty 1

## 2020-09-28 MED ORDER — PREDNISONE 50 MG PO TABS: 50.0000 mg | ORAL_TABLET | Freq: Every day | ORAL | 0 refills | Status: AC

## 2020-09-28 NOTE — ED Notes (Signed)
Pt here with reports of n/t to left thumb and 2nd, 3rd fingertips x 3 weeks

## 2020-09-28 NOTE — Discharge Instructions (Addendum)
Discussed your MRI showed you have some mild narrowing causing compression of the nerve C7 which is likely causing your symptoms.  I started you on a short steroid course.  If you are diabetic, sure to check your blood sugars, stop steroids if blood sugars greater than 200.  Otherwise follow-up with neurosurgery.  I have written Neurosurgery number on your discharge paperwork.  You will need to call to schedule appointment.  I have also written you for some lidocaine patches to help with the pain.  Return for new or worsening symptoms

## 2020-09-28 NOTE — ED Provider Notes (Addendum)
[facility] EMERGENCY DEPARTMENT Provider Note   CSN: [id number] Arrival date & time: 09/28/20  1400     History Chief Complaint  Patient presents with   Numbness    Luis Gilbert is a 55 y.o. male with past medical history significant for MI, PE, HTN, alcohol abuse who presents for evaluation of arm numbness.  Has been present times months.  He has not followed with PCP.  Does have remote history approximately 7 months ago large PE.  Initially had taken anticoagulation however has not followed since prescription completed.  He does state he occasionally has some neck pain.  No recent trauma or injury.  No facial droop, headaches, weakness, chest pain, shortness of breath, abdominal pain.  He has not take anything for symptoms.  Is not followed with PCP. Tingling worse at c6-c7 distribution to LUE. Denies additional aggravating or alleviating factors.  He denies any recent illicit substance use.  History obtained from patient and past medical records.  No interpreter used.  HPI     Past Medical History:  Diagnosis Date   Heart attack ([place])    Hypertension    PE (pulmonary thromboembolism) ([place]) 04/2019    Patient Active Problem List   Diagnosis Date Noted   Acute deep vein thrombosis (DVT) of left lower extremity ([place]) 05/01/2019   Hepatic steatosis 05/01/2019   Alcohol abuse with intoxication ([place]) 05/01/2019   Polysubstance abuse ([place]) 05/01/2019   Hypokalemia 05/01/2019   Pulmonary embolism ([place]) 95/62/1308   Alcoholic intoxication without complication ([place]) 65/78/4696   Elevated troponin 04/28/2019   Pulmonary emboli ([place]) 04/28/2019    Past Surgical History:  Procedure Laterality Date   ARM SURGERY         No family history on file.  Social History   Tobacco Use   Smoking status: Every Day    Packs/day: 1.00    Types: Cigarettes   Smokeless tobacco: Never  Vaping Use   Vaping Use: Never used  Substance Use Topics   Alcohol use: Yes   Drug use: Yes     Types: Marijuana    Home Medications Prior to Admission medications   Medication Sig Start Date End Date Taking? Authorizing Provider  acetaminophen (TYLENOL) 500 MG tablet Take 500 mg by mouth every 6 (six) hours as needed.   Yes [provider]  apixaban (ELIQUIS) 5 MG TABS tablet Take 1 tablet (5 mg total) by mouth 2 (two) times daily. Start after completing the Eliquis Starter Pack Patient taking differently: Take 5 mg by mouth daily. 05/09/19  Yes Allie Bossier, MD  lidocaine (LIDODERM) 5 % Place 1 patch onto the skin daily. Remove & Discard patch within 12 hours or as directed by MD 09/28/20  Yes Madgie Dhaliwal A, PA-C  naproxen sodium (ALEVE) 220 MG tablet Take 440 mg by mouth.   Yes [provider]  predniSONE (DELTASONE) 50 MG tablet Take 1 tablet (50 mg total) by mouth daily for 5 days. 09/28/20 10/03/20 Yes Cherylee Rawlinson A, PA-C  Apixaban Starter Pack (ELIQUIS DVT/PE STARTER PACK) 5 MG TBPK Take as directed on package: start with two-5mg  tablets twice daily for 7 days. On day 8, switch to one-5mg  tablet twice daily. Patient not taking: Reported on 09/28/2020 05/02/19   Allie Bossier, MD  folic acid (FOLVITE) 1 MG tablet Take 1 tablet (1 mg total) by mouth daily. Patient not taking: Reported on 09/28/2020 05/03/19   Allie Bossier, MD  Multiple Vitamin (MULTIVITAMIN WITH MINERALS) TABS  tablet Take 1 tablet by mouth daily. Patient not taking: Reported on 09/28/2020 05/03/19   Allie Bossier, MD  nicotine (NICODERM CQ - DOSED IN MG/24 HOURS) 21 mg/24hr patch Place 1 patch (21 mg total) onto the skin daily. Patient not taking: Reported on 09/28/2020 05/03/19   Allie Bossier, MD  ondansetron (ZOFRAN) 4 MG tablet Take 1 tablet (4 mg total) by mouth every 6 (six) hours as needed for nausea. Patient not taking: Reported on 09/28/2020 05/02/19   Allie Bossier, MD  thiamine 100 MG tablet Take 1 tablet (100 mg total) by mouth daily. Patient not taking: Reported on 09/28/2020 05/03/19    Allie Bossier, MD  traMADol (ULTRAM) 50 MG tablet Take 1 tablet (50 mg total) by mouth every 8 (eight) hours as needed for moderate pain. Patient not taking: Reported on 09/28/2020 05/02/19   Allie Bossier, MD    Allergies    Tomato  Review of Systems   Review of Systems  Constitutional: Negative.   HENT: Negative.    Respiratory: Negative.    Cardiovascular: Negative.   Gastrointestinal: Negative.   Genitourinary: Negative.   Musculoskeletal: Negative.   Skin: Negative.   Neurological:  Positive for numbness. Negative for dizziness, tremors, seizures, syncope, facial asymmetry, speech difficulty, light-headedness and headaches.  All other systems reviewed and are negative.  Physical Exam Updated Vital Signs BP 124/88   Pulse 74   Temp 97.6 F (36.4 C) (Oral)   Resp 15   SpO2 95%   Physical Exam Physical Exam  Constitutional: Pt is oriented to person, place, and time. Pt appears well-developed and well-nourished. No distress.  HENT:  Head: Normocephalic and atraumatic.  Mouth/Throat: Oropharynx is clear and moist.  Eyes: Conjunctivae and EOM are normal. Pupils are equal, round, and reactive to light. No scleral icterus.  No horizontal, vertical or rotational nystagmus  Neck: Normal range of motion. Neck supple.  Full active and passive ROM without pain No midline or paraspinal tenderness No nuchal rigidity or meningeal signs  Cardiovascular: Normal rate, regular rhythm and intact distal pulses.   Pulmonary/Chest: Effort normal and breath sounds normal. No respiratory distress. Pt has no wheezes. No rales.  Abdominal: Soft. Bowel sounds are normal. There is no tenderness. There is no rebound and no guarding.  Musculoskeletal: Normal range of motion.  Lymphadenopathy:    No cervical adenopathy.  Neurological: Pt. is alert and oriented to person, place, and time. He has normal reflexes. No cranial nerve deficit.  Exhibits normal muscle tone. Coordination normal.  Mental  Status:  Alert, oriented, thought content appropriate. Speech fluent without evidence of aphasia. Able to follow 2 step commands without difficulty.  Cranial Nerves:  II:  Peripheral visual fields grossly normal, pupils equal, round, reactive to light III,IV, VI: ptosis not present, extra-ocular motions intact bilaterally  V,VII: smile symmetric, facial light touch sensation equal VIII: hearing grossly normal bilaterally  IX,X: midline uvula rise  XI: bilateral shoulder shrug equal and strong XII: midline tongue extension  Motor:  5/5 in upper and lower extremities bilaterally including strong and equal grip strength and dorsiflexion/plantar flexion Sensory: Altered sensation at c6-c7 distribution left upper extremity Deep Tendon Reflexes: 2+ and symmetric  Cerebellar: normal finger-to-nose with bilateral upper extremities Gait: normal gait and balance CV: distal pulses palpable throughout   Skin: Skin is warm and dry. No rash noted. Pt is not diaphoretic.  Psychiatric: Pt has a normal mood and affect. Behavior is normal. Judgment and thought content  normal.  Nursing note and vitals reviewed.  ED Results / Procedures / Treatments   Labs (all labs ordered are listed, but only abnormal results are displayed) Labs Reviewed  CBC WITH DIFFERENTIAL/PLATELET - Abnormal; Notable for the following components:      Result Value   MCH 34.1 (*)    All other components within normal limits  BASIC METABOLIC PANEL - Abnormal; Notable for the following components:   Sodium 131 (*)    CO2 18 (*)    Calcium 8.8 (*)    All other components within normal limits    EKG None  Radiology MR BRAIN WO CONTRAST  Result Date: 09/28/2020 CLINICAL DATA:  Neuro deficit. Acute. Stroke suspected. Left arm numbness. Headache and neck pain. EXAM: MRI HEAD WITHOUT CONTRAST TECHNIQUE: Multiplanar, multiecho pulse sequences of the brain and surrounding structures were obtained without intravenous contrast.  COMPARISON:  None. FINDINGS: Brain: The study suffers from motion degradation. Diffusion imaging does not show any acute or subacute infarction. The brainstem and cerebellum are normal. Cerebral hemispheres show chronic small-vessel ischemic changes of the thalami and basal ganglia, right more than left. No cortical or large vessel territory infarction. No mass lesion, hemorrhage, hydrocephalus or extra-axial collection. Vascular: Major vessels at the base of the brain show flow. Skull and upper cervical spine: Negative Sinuses/Orbits: Clear/normal Other: None IMPRESSION: Abbreviated, motion degraded exam. No acute finding. Chronic small-vessel ischemic changes of the thalami and basal ganglia, right more than left. Electronically Signed   By: Nelson Chimes M.D.   On: 09/28/2020 16:44   MR Cervical Spine Wo Contrast  Result Date: 09/28/2020 CLINICAL DATA:  Cervical radiculopathy. No red flags. Headache and neck pain. EXAM: MRI CERVICAL SPINE WITHOUT CONTRAST TECHNIQUE: Multiplanar, multisequence MR imaging of the cervical spine was performed. No intravenous contrast was administered. COMPARISON:  None. FINDINGS: Abbreviated, motion degraded exam. Alignment: Normal alignment. Vertebrae: Negative Cord: Normal.  No cord compression or cord lesion. Posterior Fossa, vertebral arteries, paraspinal tissues: Negative Disc levels: No abnormality at C4-5 or above. C5-6: Minimal disc bulge.  No stenosis. C6-7: Left foraminal encroachment by osteophyte in disc material could affect the left C7 nerve. IMPRESSION: Abbreviated, motion degraded exam. Left foraminal stenosis at C6-7 apparently due to encroachment by osteophyte and disc material that could compress the left C7 nerve. Otherwise negative study. Electronically Signed   By: Nelson Chimes M.D.   On: 09/28/2020 16:47    Procedures Procedures   Medications Ordered in ED Medications  LORazepam (ATIVAN) injection 0.5 mg (0.5 mg Intravenous Given 09/28/20 1543)    ED  Course  I have reviewed the triage vital signs and the nursing notes.  Pertinent labs & imaging results that were available during my care of the patient were reviewed by me and considered in my medical decision making (see chart for details).  Here for evaluation of numbness to left upper extremity over the last 3 months.  Worse to digits 1 through 3.  He is afebrile, nonseptic, not ill-appearing.  No traumatic injuries.  Does have altered sensation C6-C7 distribution to his left upper extremity.  No chest pain, shortness of breath to suggest dissection.  Recently completed anticoagulation for PE.  No overlying swelling, redness or warmth.  We will plan on labs, imaging and reassess.  Labs and imaging personally reviewed and interpreted:  CBC without leukocytosis Metabolic panel sodium 631, CO2 18 MR brain without significant normality MR cervical spine with foraminal stenosis at due to osteophyte possibly causing compression C7  EKG without ischemic chanegs  MR cervical spine seems to correlate with paresthesias to left upper extremity and distribution.  Will start on short course of steroids, follow-up with neurosurgery, establish care with PCP.  Patient agreeable.  Low suspicion for acute CVA, ACS, PE, dissection causing symptoms  The patient has been appropriately medically screened and/or stabilized in the ED. I have low suspicion for any other emergent medical condition which would require further screening, evaluation or treatment in the ED or require inpatient management.  Patient is hemodynamically stable and in no acute distress.  Patient able to ambulate in department prior to ED.  Evaluation does not show acute pathology that would require ongoing or additional emergent interventions while in the emergency department or further inpatient treatment.  I have discussed the diagnosis with the patient and answered all questions.  Pain is been managed while in the emergency department and  patient has no further complaints prior to discharge.  Patient is comfortable with plan discussed in room and is stable for discharge at this time.  I have discussed strict return precautions for returning to the emergency department.  Patient was encouraged to follow-up with PCP/specialist refer to at discharge.     MDM Rules/Calculators/A&P                            Final Clinical Impression(s) / ED Diagnoses Final diagnoses:  Foraminal stenosis of cervical region    Rx / DC Orders ED Discharge Orders          Ordered    predniSONE (DELTASONE) 50 MG tablet  Daily        09/28/20 1726    lidocaine (LIDODERM) 5 %  Every 24 hours        09/28/20 1726                 Seeley Southgate A, PA-C 09/28/20 1729    Isla Pence, MD 09/29/20 [phone]

## 2020-09-28 NOTE — ED Triage Notes (Signed)
Numbness left arm for 3 months

## 2020-11-18 ENCOUNTER — Telehealth: Payer: Self-pay

## 2020-11-18 NOTE — Telephone Encounter (Signed)
Client renewed with [facility] and would want to change providers. Client was last seen at [facility]. Unsure of last date seen  Wife Aldona Bar reports client took last eliquis last week. Unsure when last application to [facility] was, he will bring paper to appointment along with his medications.  Discussed food market at [employer] to supplement foods.  Wife states she will be able to get him transportation to appointment.   Will plan to follow up after appointment that was set for 11/25/20 at 10am. Discussed copay of 10$ for visit for [facility].  Debria Garret RN Clara Gunn/[facility].

## 2020-11-25 ENCOUNTER — Ambulatory Visit: Payer: Self-pay | Admitting: Physician Assistant

## 2020-11-26 ENCOUNTER — Encounter (HOSPITAL_COMMUNITY): Payer: Self-pay

## 2020-11-26 ENCOUNTER — Other Ambulatory Visit: Payer: Self-pay

## 2020-11-26 ENCOUNTER — Emergency Department (HOSPITAL_COMMUNITY)
Admission: EM | Admit: 2020-11-26 | Discharge: 2020-11-26 | Disposition: A | Discharge status: Home / Self Care | Payer: Self-pay | Attending: Emergency Medicine | Admitting: Emergency Medicine

## 2020-11-26 DIAGNOSIS — I10 Essential (primary) hypertension: Secondary | ICD-10-CM | POA: Insufficient documentation

## 2020-11-26 DIAGNOSIS — S31000A Unspecified open wound of lower back and pelvis without penetration into retroperitoneum, initial encounter: Secondary | ICD-10-CM | POA: Insufficient documentation

## 2020-11-26 DIAGNOSIS — W268XXA Contact with other sharp object(s), not elsewhere classified, initial encounter: Secondary | ICD-10-CM | POA: Insufficient documentation

## 2020-11-26 DIAGNOSIS — F17210 Nicotine dependence, cigarettes, uncomplicated: Secondary | ICD-10-CM | POA: Insufficient documentation

## 2020-11-26 DIAGNOSIS — Z4800 Encounter for change or removal of nonsurgical wound dressing: Secondary | ICD-10-CM | POA: Insufficient documentation

## 2020-11-26 DIAGNOSIS — Z7901 Long term (current) use of anticoagulants: Secondary | ICD-10-CM | POA: Insufficient documentation

## 2020-11-26 DIAGNOSIS — Z5189 Encounter for other specified aftercare: Secondary | ICD-10-CM

## 2020-11-26 DIAGNOSIS — Z23 Encounter for immunization: Secondary | ICD-10-CM | POA: Insufficient documentation

## 2020-11-26 MED ORDER — TETANUS-DIPHTH-ACELL PERTUSSIS 5-2.5-18.5 LF-MCG/0.5 IM SUSY
0.5000 mL | PREFILLED_SYRINGE | Freq: Once | INTRAMUSCULAR | Status: AC
  Administered 2020-11-26: 0.5 mL via INTRAMUSCULAR
  Filled 2020-11-26: qty 0.5

## 2020-11-26 NOTE — ED Triage Notes (Signed)
Pt. States they were working on a project outside and got punctured by a piece of metal. Upon assessment pt. Has a scratch on their lower right back.

## 2020-11-26 NOTE — Discharge Instructions (Addendum)
Get help right away if: You have a red streak of skin near the area around your wound. Your wound has been closed with staples, sutures, skin glue, or adhesive strips and it begins to open up and separate. Your wound is bleeding, and the bleeding does not stop with gentle pressure. You have a rash. You faint. You have trouble breathing.

## 2020-11-26 NOTE — ED Provider Notes (Signed)
[facility] EMERGENCY DEPARTMENT Provider Note   CSN: [id number] Arrival date & time: 11/26/20  1612     History Chief Complaint  Patient presents with   Wound Check    Luis Gilbert is a 55 y.o. male with a past medical history of PE who is currently anticoagulated who presents emergency department for evaluation of a wound.  Patient states that he backed into a metal piece while doing some work at [employer] yesterday and scratched his right lower back.  He has no active bleeding.  Patient states that he thought he might need a tetanus shot because its been a very long time.  He has no other complaints.  He denies fevers chills redness or pain.   Wound Check      Past Medical History:  Diagnosis Date   Heart attack [facility])    Hypertension    PE (pulmonary thromboembolism) ([place]) 04/2019    Patient Active Problem List   Diagnosis Date Noted   Acute deep vein thrombosis (DVT) of left lower extremity ([place]) 05/01/2019   Hepatic steatosis 05/01/2019   Alcohol abuse with intoxication ([place]) 05/01/2019   Polysubstance abuse ([place]) 05/01/2019   Hypokalemia 05/01/2019   Pulmonary embolism ([place]) 36/64/4034   Alcoholic intoxication without complication ([place]) 74/25/9563   Elevated troponin 04/28/2019   Pulmonary emboli ([place]) 04/28/2019    Past Surgical History:  Procedure Laterality Date   ARM SURGERY         History reviewed. No pertinent family history.  Social History   Tobacco Use   Smoking status: Every Day    Packs/day: 1.00    Types: Cigarettes   Smokeless tobacco: Never  Vaping Use   Vaping Use: Never used  Substance Use Topics   Alcohol use: Yes   Drug use: Yes    Types: Marijuana    Home Medications Prior to Admission medications   Medication Sig Start Date End Date Taking? Authorizing Provider  acetaminophen (TYLENOL) 500 MG tablet Take 500 mg by mouth every 6 (six) hours as needed.    [provider]  apixaban (ELIQUIS) 5 MG TABS tablet  Take 1 tablet (5 mg total) by mouth 2 (two) times daily. Start after completing the Eliquis Starter Pack Patient taking differently: Take 5 mg by mouth daily. 05/09/19   Allie Bossier, MD  Apixaban Starter Pack [facility] DVT/PE STARTER PACK) 5 MG TBPK Take as directed on package: start with two-5mg  tablets twice daily for 7 days. On day 8, switch to one-5mg  tablet twice daily. Patient not taking: Reported on 09/28/2020 05/02/19   Allie Bossier, MD  folic acid (FOLVITE) 1 MG tablet Take 1 tablet (1 mg total) by mouth daily. Patient not taking: Reported on 09/28/2020 05/03/19   Allie Bossier, MD  lidocaine (LIDODERM) 5 % Place 1 patch onto the skin daily. Remove & Discard patch within 12 hours or as directed by MD 09/28/20   Henderly, Britni A, PA-C  Multiple Vitamin (MULTIVITAMIN WITH MINERALS) TABS tablet Take 1 tablet by mouth daily. Patient not taking: Reported on 09/28/2020 05/03/19   Allie Bossier, MD  naproxen sodium (ALEVE) 220 MG tablet Take 440 mg by mouth.    [provider]  nicotine (NICODERM CQ - DOSED IN MG/24 HOURS) 21 mg/24hr patch Place 1 patch (21 mg total) onto the skin daily. Patient not taking: Reported on 09/28/2020 05/03/19   Allie Bossier, MD  ondansetron (ZOFRAN) 4 MG tablet Take 1 tablet (4 mg total) by  mouth every 6 (six) hours as needed for nausea. Patient not taking: Reported on 09/28/2020 05/02/19   Allie Bossier, MD  thiamine 100 MG tablet Take 1 tablet (100 mg total) by mouth daily. Patient not taking: Reported on 09/28/2020 05/03/19   Allie Bossier, MD  traMADol (ULTRAM) 50 MG tablet Take 1 tablet (50 mg total) by mouth every 8 (eight) hours as needed for moderate pain. Patient not taking: Reported on 09/28/2020 05/02/19   Allie Bossier, MD    Allergies    Tomato  Review of Systems   Review of Systems  Constitutional:  Negative for chills and fever.  Skin:  Positive for wound.    Physical Exam Updated Vital Signs BP (!) 149/93 (BP Location: Right Arm)   Pulse  71   Temp 98.3 F (36.8 C) (Oral)   Resp 17   Ht 5\' 11"  (1.803 m)   Wt 98.4 kg   SpO2 97%   BMI 30.27 kg/m   Physical Exam Vitals and nursing note reviewed.  Constitutional:      General: He is not in acute distress.    Appearance: He is well-developed. He is not diaphoretic.  HENT:     Head: Normocephalic and atraumatic.  Eyes:     General: No scleral icterus.    Conjunctiva/sclera: Conjunctivae normal.  Cardiovascular:     Rate and Rhythm: Normal rate and regular rhythm.     Heart sounds: Normal heart sounds.  Pulmonary:     Effort: Pulmonary effort is normal. No respiratory distress.     Breath sounds: Normal breath sounds.  Abdominal:     Palpations: Abdomen is soft.     Tenderness: There is no abdominal tenderness.  Musculoskeletal:     Cervical back: Normal range of motion and neck supple.  Skin:    General: Skin is warm and dry.     Comments: Small scratches to the right lower back.  Small puncture wound just above, no active bleeding, ecchymosis or signs of infection  Neurological:     Mental Status: He is alert.  Psychiatric:        Behavior: Behavior normal.    ED Results / Procedures / Treatments   Labs (all labs ordered are listed, but only abnormal results are displayed) Labs Reviewed - No data to display  EKG None  Radiology No results found.  Procedures Procedures   Medications Ordered in ED Medications  Tdap (BOOSTRIX) injection 0.5 mL (has no administration in time range)    ED Course  I have reviewed the triage vital signs and the nursing notes.  Pertinent labs & imaging results that were available during my care of the patient were reviewed by me and considered in my medical decision making (see chart for details).    MDM Rules/Calculators/A&P                           Patient here with scratch to his back.  Tetanus vaccine updated.  Patient given a work note.  No signs or symptoms of infection.  He appears otherwise appropriate  for discharge at this time Final Clinical Impression(s) / ED Diagnoses Final diagnoses:  None    Rx / DC Orders ED Discharge Orders     None        Margarita Mail, PA-C 11/26/20 [place], [place], MD 11/28/20 1343

## 2020-12-01 ENCOUNTER — Other Ambulatory Visit: Payer: Self-pay

## 2020-12-01 ENCOUNTER — Encounter: Payer: Self-pay | Admitting: Physician Assistant

## 2020-12-01 ENCOUNTER — Ambulatory Visit: Payer: Self-pay | Admitting: Physician Assistant

## 2020-12-01 VITALS — BP 155/93 | HR 82 | Temp 97.2°F | Ht 70.0 in | Wt 221.0 lb

## 2020-12-01 DIAGNOSIS — Z131 Encounter for screening for diabetes mellitus: Secondary | ICD-10-CM

## 2020-12-01 DIAGNOSIS — R1011 Right upper quadrant pain: Secondary | ICD-10-CM

## 2020-12-01 DIAGNOSIS — I10 Essential (primary) hypertension: Secondary | ICD-10-CM

## 2020-12-01 DIAGNOSIS — M5416 Radiculopathy, lumbar region: Secondary | ICD-10-CM

## 2020-12-01 DIAGNOSIS — F1290 Cannabis use, unspecified, uncomplicated: Secondary | ICD-10-CM

## 2020-12-01 DIAGNOSIS — E669 Obesity, unspecified: Secondary | ICD-10-CM

## 2020-12-01 DIAGNOSIS — Z1211 Encounter for screening for malignant neoplasm of colon: Secondary | ICD-10-CM

## 2020-12-01 DIAGNOSIS — F1090 Alcohol use, unspecified, uncomplicated: Secondary | ICD-10-CM

## 2020-12-01 DIAGNOSIS — R911 Solitary pulmonary nodule: Secondary | ICD-10-CM

## 2020-12-01 DIAGNOSIS — M4802 Spinal stenosis, cervical region: Secondary | ICD-10-CM

## 2020-12-01 DIAGNOSIS — F17200 Nicotine dependence, unspecified, uncomplicated: Secondary | ICD-10-CM

## 2020-12-01 DIAGNOSIS — Z7689 Persons encountering health services in other specified circumstances: Secondary | ICD-10-CM

## 2020-12-01 DIAGNOSIS — Z86711 Personal history of pulmonary embolism: Secondary | ICD-10-CM

## 2020-12-01 DIAGNOSIS — Z13220 Encounter for screening for lipoid disorders: Secondary | ICD-10-CM

## 2020-12-01 DIAGNOSIS — Z125 Encounter for screening for malignant neoplasm of prostate: Secondary | ICD-10-CM

## 2020-12-01 MED ORDER — AMLODIPINE BESYLATE 5 MG PO TABS: 5.0000 mg | ORAL_TABLET | Freq: Every day | ORAL | 0 refills | Status: DC

## 2020-12-01 NOTE — Progress Notes (Signed)
BP (!) 155/93   Pulse 82   Temp (!) 97.2 F (36.2 C)   Ht 5\' 10"  (1.778 m)   Wt 221 lb (100.2 kg)   SpO2 96%   BMI 31.71 kg/m    Subjective:    Patient ID: Luis Gilbert, male    DOB: 1965/05/28, 55 y.o.   MRN: [id number]  HPI: Luis Gilbert is a 55 y.o. male presenting on 12/01/2020 for New Patient (Initial Visit)   HPI   Pt had a negative covid 19 screening questionnaire.  Chief Complaint  Patient presents with   New Patient (Initial Visit)      Pt previously went to [facility] clinic but says he hasn't been there in almost a year.  He says he didn't want to go back there because it was too expensive.  Pt has been out of most of his meds for about 6 months.  He has been out of his eliquis for about a month.   He has some nitrostat but hasn't needed it.   Pt says he had a heart attack.  EPic records reviewed-  troponin elevated but likely due to PE nad not MI.    He is feeling good.  He is having No cp or sob.    He smokes 3 cigarettes daily.  he Drinks 1- 40oz daily.  He Doesn't consider himself a heavy drinker.  He uses Marijuana every other day.  He is Not working.  He Used to work as Training and development officer until about 2 years.  He Was at [employer].    He does not exercise.  He says he has never been on bp meds  Pt reports Pain in his neck that goes into left shoulder and down in to lower back.  It affects his sleep almost every night.   It limits activites like he can't rake the yard.   Pt had MRI in ER and was told to follow up with Neurosurgery but he didn't schedule due to cost.  He says the prednisone that he was given in ER did not help.  Right this minute he rates his pain 8/10.   Pt says his pain is more in his lower back than in his neck.  He has not gotten the covid vacciantion  Hepatic steatosis and/or hepatocellular disease per echo 04/29/19  Pt was in hospital in March 2021 with pulmonary emboli.  Pt says he was not told the etiology and review of epic records do not  indicate any possible reasons.   Pt was discharged on apixiban.  He has been out of this medication for longer than a month.  CT 07/16/2006- lung nodule felt to be a subpleural lymph node.    Relevant past medical, surgical, family and social history reviewed and updated as indicated. Interim medical history since our last visit reviewed. Allergies and medications reviewed and updated.  CURRENT MEDS: none  Review of Systems  Per HPI unless specifically indicated above     Objective:    BP (!) 155/93   Pulse 82   Temp (!) 97.2 F (36.2 C)   Ht 5\' 10"  (1.778 m)   Wt 221 lb (100.2 kg)   SpO2 96%   BMI 31.71 kg/m   Wt Readings from Last 3 Encounters:  12/01/20 221 lb (100.2 kg)  11/26/20 217 lb (98.4 kg)  01/26/20 227 lb (103 kg)    Physical Exam Vitals reviewed.  Constitutional:      General: He is not in acute  distress.    Appearance: He is well-developed. He is obese. He is not toxic-appearing.  HENT:     Head: Normocephalic and atraumatic.     Right Ear: Tympanic membrane, ear canal and external ear normal.     Left Ear: Tympanic membrane, ear canal and external ear normal.  Eyes:     Extraocular Movements: Extraocular movements intact.     Conjunctiva/sclera: Conjunctivae normal.     Pupils: Pupils are equal, round, and reactive to light.  Neck:     Thyroid: No thyromegaly.  Cardiovascular:     Rate and Rhythm: Normal rate and regular rhythm.  Pulmonary:     Effort: Pulmonary effort is normal.     Breath sounds: Normal breath sounds. No wheezing or rales.  Abdominal:     General: Bowel sounds are normal.     Palpations: Abdomen is soft. There is no mass.     Tenderness: There is abdominal tenderness. There is no guarding or rebound.  Musculoskeletal:     Cervical back: Neck supple. Tenderness present. No bony tenderness.     Lumbar back: Tenderness present. No bony tenderness.     Right lower leg: No edema.     Left lower leg: No edema.  Lymphadenopathy:      Cervical: No cervical adenopathy.  Skin:    General: Skin is warm and dry.  Neurological:     Mental Status: He is alert and oriented to person, place, and time.     Motor: No weakness or tremor.     Gait: Gait is intact.  Psychiatric:        Behavior: Behavior normal. Behavior is cooperative.     L 4th finger distal end with deformity due to long ago injury RUQ tenderness       Assessment & Plan:   Encounter Diagnoses  Name Primary?   Encounter to establish care Yes   Primary hypertension    RUQ abdominal pain    Cervical stenosis of spine    History of pulmonary embolism    Tobacco use disorder    Heavy alcohol consumption    Marijuana user    Screening for colon cancer    Screening for malignant neoplasm of prostate    Screening for diabetes mellitus    Screening cholesterol level    Nodule of right lung    Lumbar radiculopathy    Obesity, unspecified classification, unspecified obesity type, unspecified whether serious comorbidity present       BACK PAIN By MRI, pt has cervical stenosis but pt says more pain lumbar area.  Needs MRI lumbar back.  Will get this prior to referring to Neurosurgery  ABDOMINAL PAIN Pt has Abdominal tenderness.  he needs CT abd/pelvis.    HISTORY PE & LUNG NODULE Pt with history extensive PE as well as pulmonary nodule felt to be pulmonary lymphadenopathy.  He is a smoker.  He needs CT chest.  He also needs labs to check for reasons for the PE.  Discussed with pt that testing for cause of his PE is needed to help determine need for continued anticoagulation which there is no current indication for this known at present  HTN Start bp meds- amlodipine- rx sent to [place] is given application for [facility] [employer] financial assistance.   -will get usual Baseline labs including lipids, cmp, psa -pt is given FIT test for colon cancer screening -pt is educated and encouraged to get covid vaccination -discussed with pt that one  40 ounce beer is 3 1/2 drinks and is more than is recommended for daily consumption.  Encouraged him to limit to no more than two 12 ounce beer daily or ever better to avoid altogether -pt to follow up 1 month.  He is encouraged to contact office right away for any worsening or new symptoms

## 2020-12-01 NOTE — Patient Instructions (Addendum)
-  Amlodipine/medication for blood pressure -Fasting blood tests/labs -[employer] financial assistance application ([employer]) -MRI lower back -CT chest/abdomen -Colon cancer screening test -Stop smoking -covid vaccination recommended

## 2020-12-02 ENCOUNTER — Telehealth: Payer: Self-pay

## 2020-12-02 NOTE — Telephone Encounter (Signed)
Call placed to lab to confirm orders received, was informed they would call back after speaking with supervisor regarding orders as none are showing for them.

## 2020-12-02 NOTE — Telephone Encounter (Signed)
Called client for follow up after [facility] appt. No answer, left voicemail requesting return call.  [employer]

## 2020-12-06 ENCOUNTER — Other Ambulatory Visit: Payer: Self-pay | Admitting: Physician Assistant

## 2020-12-06 DIAGNOSIS — Z1211 Encounter for screening for malignant neoplasm of colon: Secondary | ICD-10-CM

## 2020-12-09 ENCOUNTER — Telehealth: Payer: Self-pay

## 2020-12-09 NOTE — Telephone Encounter (Signed)
Spoke with pt & informed of CT scheduled for 01/07/2021 at 12 pm & advised MRI to be done after, also advised pt labs must be done before, informed he has lab appt the same day at 11am but informed he could have them done before as well.

## 2020-12-31 ENCOUNTER — Other Ambulatory Visit (HOSPITAL_COMMUNITY)
Admission: RE | Admit: 2020-12-31 | Discharge: 2020-12-31 | Disposition: A | Discharge status: Home / Self Care | Payer: Self-pay | Source: Ambulatory Visit | Attending: Physician Assistant | Admitting: Physician Assistant

## 2020-12-31 DIAGNOSIS — R1011 Right upper quadrant pain: Secondary | ICD-10-CM

## 2020-12-31 DIAGNOSIS — F1090 Alcohol use, unspecified, uncomplicated: Secondary | ICD-10-CM

## 2020-12-31 DIAGNOSIS — I10 Essential (primary) hypertension: Secondary | ICD-10-CM

## 2020-12-31 DIAGNOSIS — Z131 Encounter for screening for diabetes mellitus: Secondary | ICD-10-CM

## 2020-12-31 DIAGNOSIS — Z13220 Encounter for screening for lipoid disorders: Secondary | ICD-10-CM

## 2020-12-31 DIAGNOSIS — Z125 Encounter for screening for malignant neoplasm of prostate: Secondary | ICD-10-CM

## 2020-12-31 DIAGNOSIS — Z86711 Personal history of pulmonary embolism: Secondary | ICD-10-CM

## 2020-12-31 LAB — LIPID PANEL
Cholesterol: 157 mg/dL (ref 0–200)
HDL: 69 mg/dL (ref >40)
LDL Cholesterol: 80 mg/dL (ref 0–99)
Total CHOL/HDL Ratio: 2.3 RATIO
Triglycerides: 41 mg/dL (ref <150)
VLDL: 8 mg/dL (ref 0–40)

## 2020-12-31 LAB — HEMOGLOBIN A1C
Hgb A1c MFr Bld: 5.6 % (ref 4.8–5.6)
Mean Plasma Glucose: 114.02 mg/dL

## 2020-12-31 LAB — ANTITHROMBIN III: AntiThromb III Func: 97 % (ref 75–120)

## 2020-12-31 LAB — COMPREHENSIVE METABOLIC PANEL
ALT: 57 U/L — ABNORMAL HIGH (ref 0–44)
AST: 48 U/L — ABNORMAL HIGH (ref 15–41)
Albumin: 4.4 g/dL (ref 3.5–5.0)
Alkaline Phosphatase: 59 U/L (ref 38–126)
Anion gap: 10 (ref 5–15)
BUN: 13 mg/dL (ref 6–20)
CO2: 22 mmol/L (ref 22–32)
Calcium: 9.1 mg/dL (ref 8.9–10.3)
Chloride: 102 mmol/L (ref 98–111)
Creatinine, Ser: 0.9 mg/dL (ref 0.61–1.24)
GFR, Estimated: >60 mL/min (ref >60)
Glucose, Bld: 103 mg/dL — ABNORMAL HIGH (ref 70–99)
Potassium: 4.7 mmol/L (ref 3.5–5.1)
Sodium: 134 mmol/L — ABNORMAL LOW (ref 135–145)
Total Bilirubin: 1 mg/dL (ref 0.3–1.2)
Total Protein: 8.6 g/dL — ABNORMAL HIGH (ref 6.5–8.1)

## 2020-12-31 LAB — PSA: Prostatic Specific Antigen: 0.34 ng/mL (ref 0.00–4.00)

## 2021-01-01 LAB — PROTEIN C ACTIVITY: Protein C Activity: 126 % (ref 73–180)

## 2021-01-01 LAB — LUPUS ANTICOAGULANT PANEL
DRVVT: 39.1 s (ref 0.0–47.0)
PTT Lupus Anticoagulant: 31.5 s (ref 0.0–51.9)

## 2021-01-01 LAB — PROTEIN S ACTIVITY: Protein S Activity: 160 % — ABNORMAL HIGH (ref 63–140)

## 2021-01-02 LAB — BETA-2-GLYCOPROTEIN I ABS, IGG/M/A
Beta-2 Glyco I IgG: <9 GPI IgG units (ref 0–20)
Beta-2-Glycoprotein I IgA: <9 GPI IgA units (ref 0–25)
Beta-2-Glycoprotein I IgM: <9 GPI IgM units (ref 0–32)

## 2021-01-03 ENCOUNTER — Telehealth: Payer: Self-pay

## 2021-01-03 LAB — CARDIOLIPIN ANTIBODIES, IGG, IGM, IGA
Anticardiolipin IgA: <9 APL U/mL (ref 0–11)
Anticardiolipin IgG: <9 GPL U/mL (ref 0–14)
Anticardiolipin IgM: <9 MPL U/mL (ref 0–12)

## 2021-01-03 LAB — HOMOCYSTEINE: Homocysteine: 19.4 umol/L — ABNORMAL HIGH (ref 0.0–14.5)

## 2021-01-03 NOTE — Telephone Encounter (Signed)
Message received from pt requesting call back to talk about labs, returned call & pt stated that he has had labs drawn.

## 2021-01-05 ENCOUNTER — Ambulatory Visit: Payer: Self-pay | Admitting: Physician Assistant

## 2021-01-06 LAB — FACTOR 5 LEIDEN

## 2021-01-06 LAB — PROTHROMBIN GENE MUTATION

## 2021-01-07 ENCOUNTER — Ambulatory Visit (HOSPITAL_COMMUNITY)
Admission: RE | Admit: 2021-01-07 | Discharge: 2021-01-07 | Disposition: A | Discharge status: Home / Self Care | Payer: Self-pay | Source: Ambulatory Visit | Attending: Physician Assistant | Admitting: Physician Assistant

## 2021-01-07 ENCOUNTER — Encounter (HOSPITAL_COMMUNITY): Payer: Self-pay

## 2021-01-07 ENCOUNTER — Other Ambulatory Visit: Payer: Self-pay

## 2021-01-07 DIAGNOSIS — M5416 Radiculopathy, lumbar region: Secondary | ICD-10-CM | POA: Insufficient documentation

## 2021-01-07 DIAGNOSIS — Z86711 Personal history of pulmonary embolism: Secondary | ICD-10-CM

## 2021-01-07 DIAGNOSIS — R1011 Right upper quadrant pain: Secondary | ICD-10-CM

## 2021-01-07 DIAGNOSIS — R911 Solitary pulmonary nodule: Secondary | ICD-10-CM | POA: Insufficient documentation

## 2021-01-07 DIAGNOSIS — F17200 Nicotine dependence, unspecified, uncomplicated: Secondary | ICD-10-CM

## 2021-01-10 ENCOUNTER — Telehealth: Payer: Self-pay

## 2021-01-10 NOTE — Telephone Encounter (Signed)
Spoke with pt to confirm upcoming appt 01/12/2021.

## 2021-01-11 ENCOUNTER — Ambulatory Visit (HOSPITAL_COMMUNITY)
Admission: RE | Admit: 2021-01-11 | Discharge: 2021-01-11 | Disposition: A | Discharge status: Home / Self Care | Payer: Self-pay | Source: Ambulatory Visit | Attending: Physician Assistant | Admitting: Physician Assistant

## 2021-01-11 ENCOUNTER — Other Ambulatory Visit: Payer: Self-pay

## 2021-01-11 DIAGNOSIS — R1011 Right upper quadrant pain: Secondary | ICD-10-CM | POA: Insufficient documentation

## 2021-01-11 MED ORDER — IOHEXOL 300 MG/ML  SOLN
100.0000 mL | Freq: Once | INTRAMUSCULAR | Status: AC | PRN
  Administered 2021-01-11: 100 mL via INTRAVENOUS

## 2021-01-12 ENCOUNTER — Encounter: Payer: Self-pay | Admitting: Physician Assistant

## 2021-01-12 ENCOUNTER — Ambulatory Visit: Payer: Self-pay | Admitting: Physician Assistant

## 2021-01-12 VITALS — BP 125/80 | HR 100 | Temp 98.0°F | Wt 217.0 lb

## 2021-01-12 DIAGNOSIS — F1090 Alcohol use, unspecified, uncomplicated: Secondary | ICD-10-CM

## 2021-01-12 DIAGNOSIS — M48061 Spinal stenosis, lumbar region without neurogenic claudication: Secondary | ICD-10-CM

## 2021-01-12 DIAGNOSIS — R7989 Other specified abnormal findings of blood chemistry: Secondary | ICD-10-CM

## 2021-01-12 DIAGNOSIS — K760 Fatty (change of) liver, not elsewhere classified: Secondary | ICD-10-CM

## 2021-01-12 DIAGNOSIS — M4802 Spinal stenosis, cervical region: Secondary | ICD-10-CM

## 2021-01-12 DIAGNOSIS — F17200 Nicotine dependence, unspecified, uncomplicated: Secondary | ICD-10-CM

## 2021-01-12 DIAGNOSIS — R1011 Right upper quadrant pain: Secondary | ICD-10-CM

## 2021-01-12 DIAGNOSIS — F1290 Cannabis use, unspecified, uncomplicated: Secondary | ICD-10-CM

## 2021-01-12 DIAGNOSIS — I10 Essential (primary) hypertension: Secondary | ICD-10-CM

## 2021-01-12 DIAGNOSIS — Z86711 Personal history of pulmonary embolism: Secondary | ICD-10-CM

## 2021-01-12 LAB — IFOBT (OCCULT BLOOD): IFOBT: NEGATIVE

## 2021-01-12 MED ORDER — RIVAROXABAN 10 MG PO TABS: 10.0000 mg | ORAL_TABLET | Freq: Every day | ORAL | 0 refills | Status: DC

## 2021-01-12 MED ORDER — AMLODIPINE BESYLATE 5 MG PO TABS: 5.0000 mg | ORAL_TABLET | Freq: Every day | ORAL | 0 refills | Status: DC

## 2021-01-12 NOTE — Progress Notes (Signed)
BP 125/80   Pulse 100   Temp 98 F (36.7 C)   Wt 217 lb (98.4 kg)   SpO2 96%   BMI 31.14 kg/m    Subjective:    Patient ID: Luis Gilbert, male    DOB: 01/08/1966, 55 y.o.   MRN: [id number]  HPI: Luis Gilbert is a 55 y.o. male presenting on 01/12/2021 for Follow-up   HPI    Chief Complaint  Patient presents with   Follow-up     He did not submit his cafa  He says his Abd pain is a little better- hurts one or two days/week.  He says the pain is more if you poke at it.  He has not gotten covid vaccination  He still has the back and neck pain- about every day.   Prevents him from doing regular activities.  He is still smoking and drinks 2- 40oz beers   Relevant past medical, surgical, family and social history reviewed and updated as indicated. Interim medical history since our last visit reviewed. Allergies and medications reviewed and updated.   Current Outpatient Medications:    amLODipine (NORVASC) 5 MG tablet, Take 1 tablet (5 mg total) by mouth daily., Disp: 90 tablet, Rfl: 0    Review of Systems  Per HPI unless specifically indicated above     Objective:    BP 125/80   Pulse 100   Temp 98 F (36.7 C)   Wt 217 lb (98.4 kg)   SpO2 96%   BMI 31.14 kg/m   Wt Readings from Last 3 Encounters:  01/12/21 217 lb (98.4 kg)  12/01/20 221 lb (100.2 kg)  11/26/20 217 lb (98.4 kg)    Physical Exam Vitals reviewed.  Constitutional:      General: He is not in acute distress.    Appearance: He is well-developed. He is not ill-appearing.  HENT:     Head: Normocephalic and atraumatic.  Cardiovascular:     Rate and Rhythm: Normal rate and regular rhythm.  Pulmonary:     Effort: Pulmonary effort is normal.     Breath sounds: Normal breath sounds. No wheezing.  Abdominal:     General: Bowel sounds are normal.     Palpations: Abdomen is soft.     Tenderness: There is abdominal tenderness in the right upper quadrant and right lower quadrant. There is  no guarding or rebound.     Hernia: A hernia is present. Hernia is present in the umbilical area.  Musculoskeletal:     Cervical back: Neck supple. Tenderness present. No bony tenderness. Decreased range of motion.     Lumbar back: Tenderness present. Decreased range of motion.     Right lower leg: No edema.     Left lower leg: No edema.     Comments: Lumbar tenderness >> cervical  Lymphadenopathy:     Cervical: No cervical adenopathy.  Skin:    General: Skin is warm and dry.  Neurological:     Mental Status: He is alert and oriented to person, place, and time.  Psychiatric:        Behavior: Behavior normal.    Results for orders placed or performed during the hospital encounter of 12/31/20  Comprehensive metabolic panel  Result Value Ref Range   Sodium 134 (L) 135 - 145 mmol/L   Potassium 4.7 3.5 - 5.1 mmol/L   Chloride 102 98 - 111 mmol/L   CO2 22 22 - 32 mmol/L   Glucose, Bld 103 (H) 70 -  99 mg/dL   BUN 13 6 - 20 mg/dL   Creatinine, Ser 0.90 0.61 - 1.24 mg/dL   Calcium 9.1 8.9 - 10.3 mg/dL   Total Protein 8.6 (H) 6.5 - 8.1 g/dL   Albumin 4.4 3.5 - 5.0 g/dL   AST 48 (H) 15 - 41 U/L   ALT 57 (H) 0 - 44 U/L   Alkaline Phosphatase 59 38 - 126 U/L   Total Bilirubin 1.0 0.3 - 1.2 mg/dL   GFR, Estimated >60 >60 mL/min   Anion gap 10 5 - 15  Hemoglobin A1c  Result Value Ref Range   Hgb A1c MFr Bld 5.6 4.8 - 5.6 %   Mean Plasma Glucose 114.02 mg/dL  PSA  Result Value Ref Range   Prostatic Specific Antigen 0.34 0.00 - 4.00 ng/mL  Lipid panel  Result Value Ref Range   Cholesterol 157 0 - 200 mg/dL   Triglycerides 41 <150 mg/dL   HDL 69 >40 mg/dL   Total CHOL/HDL Ratio 2.3 RATIO   VLDL 8 0 - 40 mg/dL   LDL Cholesterol 80 0 - 99 mg/dL  Prothrombin Gene Mutation  Result Value Ref Range   Recommendations-PTGENE: Comment   Protein C activity  Result Value Ref Range   Protein C Activity 126 73 - 180 %  Protein S activity  Result Value Ref Range   Protein S Activity 160  (H) 63 - 140 %  Lupus anticoagulant panel  Result Value Ref Range   PTT Lupus Anticoagulant 31.5 0.0 - 51.9 sec   DRVVT 39.1 0.0 - 47.0 sec   Lupus Anticoag Interp Comment:   Homocysteine  Result Value Ref Range   Homocysteine 19.4 (H) 0.0 - 14.5 umol/L  Factor 5 leiden  Result Value Ref Range   Recommendations-F5LEID: Comment   Beta-2-glycoprotein i abs, IgG/M/A  Result Value Ref Range   Beta-2 Glyco I IgG <9 0 - 20 GPI IgG units   Beta-2-Glycoprotein I IgM <9 0 - 32 GPI IgM units   Beta-2-Glycoprotein I IgA <9 0 - 25 GPI IgA units  Antithrombin III  Result Value Ref Range   AntiThromb III Func 97 75 - 120 %  Cardiolipin antibodies, IgG, IgM, IgA  Result Value Ref Range   Anticardiolipin IgG <9 0 - 14 GPL U/mL   Anticardiolipin IgM <9 0 - 12 MPL U/mL   Anticardiolipin IgA <9 0 - 11 APL U/mL        Assessment & Plan:   Encounter Diagnoses  Name Primary?   Primary hypertension Yes   Cervical stenosis of spine    Spinal stenosis of lumbar region, unspecified whether neurogenic claudication present    RUQ abdominal pain    History of pulmonary embolism    Tobacco use disorder    Heavy alcohol consumption    Marijuana user    Elevated LFTs    Hepatic steatosis        CERVICAL & LUMBAR STENOSIS Reviewed result lumbar MRI with pt.  Reviewed that he has spinal stenosis L-2-4 and L4-5 and C6-7. refer to Neurosurgery.  Will ask [facility] assist pt with completing financial assistance application for [facility] system   ABDOMINAL PAIN Pt has Abdominal tenderness.  results CT abd/pelvis pending.   he will be called with results when available.  He is urged to decrease his alcohol intake.   Will monitor LFTs   HISTORY PE & LUNG NODULE -reviewed results CT chest with pt.  Reviewd results testing done due to  history PE and homocysteine mildly elevated.  Discussed with pt recommendations to get back on anticoagulant to prevent recurrence of PE.  Pt agrees.  He is counseled on  watching for signs of bleeding and contacting office right away if he sees any.    HTN Improved.  continue amlodipine     -Pt is given another application for [employer] financial assistance.   -all abs and testing was reviewed with pt -pt is educated and encouraged to get covid vaccination -pt to follow up 3 month.  He is encouraged to contact office right away for any worsening or new symptoms.

## 2021-01-12 NOTE — Patient Instructions (Signed)
-   submit application for [employer] financial assistance - you will get call from Neurosurgery -encourage to get covid vaccination -encourage smoking cessation

## 2021-02-15 ENCOUNTER — Encounter (HOSPITAL_COMMUNITY): Payer: Self-pay | Admitting: *Deleted

## 2021-02-15 ENCOUNTER — Emergency Department (HOSPITAL_COMMUNITY)
Admission: EM | Admit: 2021-02-15 | Discharge: 2021-02-15 | Disposition: A | Discharge status: Home / Self Care | Payer: Self-pay | Attending: Emergency Medicine | Admitting: Emergency Medicine

## 2021-02-15 DIAGNOSIS — M5410 Radiculopathy, site unspecified: Secondary | ICD-10-CM

## 2021-02-15 DIAGNOSIS — M5416 Radiculopathy, lumbar region: Secondary | ICD-10-CM | POA: Insufficient documentation

## 2021-02-15 DIAGNOSIS — I10 Essential (primary) hypertension: Secondary | ICD-10-CM | POA: Insufficient documentation

## 2021-02-15 DIAGNOSIS — M48061 Spinal stenosis, lumbar region without neurogenic claudication: Secondary | ICD-10-CM | POA: Insufficient documentation

## 2021-02-15 DIAGNOSIS — F17210 Nicotine dependence, cigarettes, uncomplicated: Secondary | ICD-10-CM | POA: Insufficient documentation

## 2021-02-15 DIAGNOSIS — Z79899 Other long term (current) drug therapy: Secondary | ICD-10-CM | POA: Insufficient documentation

## 2021-02-15 DIAGNOSIS — Z7901 Long term (current) use of anticoagulants: Secondary | ICD-10-CM | POA: Insufficient documentation

## 2021-02-15 MED ORDER — OXYCODONE-ACETAMINOPHEN 5-325 MG PO TABS
1.0000 | ORAL_TABLET | Freq: Once | ORAL | Status: AC
  Administered 2021-02-15: 13:00:00 1 via ORAL
  Filled 2021-02-15: qty 1

## 2021-02-15 MED ORDER — HYDROCODONE-ACETAMINOPHEN 5-325 MG PO TABS: 1.0000 | ORAL_TABLET | Freq: Two times a day (BID) | ORAL | 0 refills | Status: AC | PRN

## 2021-02-15 MED ORDER — ACETAMINOPHEN 500 MG PO TABS: 500.0000 mg | ORAL_TABLET | Freq: Four times a day (QID) | ORAL | 0 refills | Status: DC | PRN

## 2021-02-15 MED ORDER — LIDOCAINE 4 % EX PTCH: 1.0000 | MEDICATED_PATCH | Freq: Two times a day (BID) | CUTANEOUS | 0 refills | Status: DC

## 2021-02-15 MED ORDER — PREDNISONE 10 MG (21) PO TBPK: ORAL_TABLET | Freq: Every day | ORAL | 0 refills | Status: DC

## 2021-02-15 MED ORDER — NAPROXEN 250 MG PO TABS
500.0000 mg | ORAL_TABLET | Freq: Once | ORAL | Status: AC
  Administered 2021-02-15: 13:00:00 500 mg via ORAL
  Filled 2021-02-15: qty 2

## 2021-02-15 NOTE — ED Provider Notes (Signed)
[facility] EMERGENCY DEPARTMENT Provider Note   CSN: [id number] Arrival date & time: 02/15/21  1028     History Chief Complaint  Patient presents with   Back Pain    Luis Gilbert is a 55 y.o. male.  HPI     55 year old male with history of hypertension and blood clots comes in with chief complaint of back pain.  He has known history of degenerative spine disease with spinal stenosis.  Patient reports that over the past 2 or 3 days he has been having some back pain.  The pain is radiating from his back, down his hip onto his thigh and sometimes lower.  The pain is worse with ambulation and also when he lays flat. Pt has no associated numbness, weakness, urinary incontinence, urinary retention, bowel incontinence, pins and needle sensation in the perineal area.   Past Medical History:  Diagnosis Date   Hypertension    PE (pulmonary thromboembolism) ([place]) 04/2019    Patient Active Problem List   Diagnosis Date Noted   Acute deep vein thrombosis (DVT) of left lower extremity ([place]) 05/01/2019   Hepatic steatosis 05/01/2019   Alcohol abuse with intoxication ([place]) 05/01/2019   Polysubstance abuse ([place]) 05/01/2019   Hypokalemia 05/01/2019   Pulmonary embolism ([place]) 52/84/1324   Alcoholic intoxication without complication ([place]) 40/11/2723   Elevated troponin 04/28/2019   Pulmonary emboli ([place]) 04/28/2019    Past Surgical History:  Procedure Laterality Date   ARM SURGERY         Family History  Problem Relation Age of Onset   Cancer Mother    Cancer Father    Cancer Maternal Uncle    Hypertension Maternal Uncle    Cancer Paternal Uncle    Hypertension Paternal Uncle     Social History   Tobacco Use   Smoking status: Every Day    Packs/day: 0.25    Types: Cigarettes   Smokeless tobacco: Never  Vaping Use   Vaping Use: Never used  Substance Use Topics   Alcohol use: Yes    Comment: drinks 1- 40 oz daily   Drug use: Yes    Types: Marijuana    Comment: MJ  qod.  no cocaine since 04/2019    Home Medications Prior to Admission medications   Medication Sig Start Date End Date Taking? Authorizing Provider  acetaminophen (TYLENOL) 500 MG tablet Take 1 tablet (500 mg total) by mouth every 6 (six) hours as needed. 02/15/21  Yes Appolonia Ackert, MD  amLODipine (NORVASC) 5 MG tablet Take 1 tablet (5 mg total) by mouth daily. 01/12/21  Yes Soyla Dryer, PA-C  HYDROcodone-acetaminophen (NORCO/VICODIN) 5-325 MG tablet Take 1 tablet by mouth every 12 (twelve) hours as needed for up to 3 days for severe pain. 02/15/21 02/18/21 Yes Varney Biles, MD  Lidocaine 4 % PTCH Apply 1 patch topically 2 (two) times daily. 02/15/21  Yes Natina Wiginton, MD  predniSONE (STERAPRED UNI-PAK 21 TAB) 10 MG (21) TBPK tablet Take by mouth daily. Take 6 tabs by mouth daily  for 2 days, then 5 tabs for 2 days, then 4 tabs for 2 days, then 3 tabs for 2 days, 2 tabs for 2 days, then 1 tab by mouth daily for 2 days 02/15/21  Yes Varney Biles, MD  rivaroxaban (XARELTO) 10 MG TABS tablet Take 1 tablet (10 mg total) by mouth daily. 01/12/21  Yes Soyla Dryer, PA-C    Allergies    Tomato and Lisinopril  Review of Systems   Review of  Systems  Constitutional:  Positive for activity change.  Musculoskeletal:  Positive for back pain.  Allergic/Immunologic: Negative for immunocompromised state.  Neurological:  Negative for weakness and numbness.  Hematological:  Bruises/bleeds easily.   Physical Exam Updated Vital Signs BP (!) 152/102    Pulse 98    Temp 99.4 F (37.4 C) (Oral)    Resp 18    SpO2 95%   Physical Exam Vitals and nursing note reviewed.  Constitutional:      Appearance: He is well-developed.  HENT:     Head: Atraumatic.  Cardiovascular:     Rate and Rhythm: Normal rate.  Pulmonary:     Effort: Pulmonary effort is normal.  Musculoskeletal:     Cervical back: Neck supple.     Comments: Pt has tenderness over the lumbar region No step offs, no  erythema. Pt has 1+ patellar reflex bilaterally. Able to discriminate between sharp and dull. Able to ambulate   Skin:    General: Skin is warm.  Neurological:     Mental Status: He is alert and oriented to person, place, and time.    ED Results / Procedures / Treatments   Labs (all labs ordered are listed, but only abnormal results are displayed) Labs Reviewed - No data to display  EKG None  Radiology No results found.  Procedures Procedures   Medications Ordered in ED Medications  oxyCODONE-acetaminophen (PERCOCET/ROXICET) 5-325 MG per tablet 1 tablet (1 tablet Oral Given 02/15/21 1321)  naproxen (NAPROSYN) tablet 500 mg (500 mg Oral Given 02/15/21 1321)    ED Course  I have reviewed the triage vital signs and the nursing notes.  Pertinent labs & imaging results that were available during my care of the patient were reviewed by me and considered in my medical decision making (see chart for details).    MDM Rules/Calculators/A&P                         55 year old male comes in with chief complaint of back pain.  Patient indicates that the pain is chronic for him, but over the last 2 days it flared up.  No specific evoking factor.  At this time, the pain is worse with certain positions.  He has no associated neurologic symptoms and has 1+ patellar reflex bilaterally.  No red flag suggesting cauda equina syndrome or cord compression.  I have reviewed patient's MRI from November and went over the findings with him.  My initial thought is that patient is likely having flareup of his chronic degenerative spine disease leading to this radicular type pain.  Return precautions discussed.  Pain control initiated.     Final Clinical Impression(s) / ED Diagnoses Final diagnoses:  Radicular pain  Spinal stenosis of lumbar region without neurogenic claudication    Rx / DC Orders ED Discharge Orders          Ordered    Lidocaine 4 % PTCH  2 times daily        02/15/21  1321    acetaminophen (TYLENOL) 500 MG tablet  Every 6 hours PRN        02/15/21 1321    HYDROcodone-acetaminophen (NORCO/VICODIN) 5-325 MG tablet  Every 12 hours PRN        02/15/21 1321    predniSONE (STERAPRED UNI-PAK 21 TAB) 10 MG (21) TBPK tablet  Daily        02/15/21 1321  Varney Biles, MD 02/15/21 1357

## 2021-02-15 NOTE — ED Triage Notes (Signed)
Recurrent back pain

## 2021-02-15 NOTE — Discharge Instructions (Addendum)
You are seen in the ER for back pain. I have reviewed your recent MRI is another x-rays.  It appears that you are likely having pinched nerve.  Take the prednisone to alleviate the swelling and the pain medication for symptom management.  Follow-up with your primary care doctor.

## 2021-03-30 ENCOUNTER — Other Ambulatory Visit: Payer: Self-pay | Admitting: Physician Assistant

## 2021-03-30 DIAGNOSIS — I10 Essential (primary) hypertension: Secondary | ICD-10-CM

## 2021-03-30 DIAGNOSIS — R7989 Other specified abnormal findings of blood chemistry: Secondary | ICD-10-CM

## 2021-04-11 ENCOUNTER — Other Ambulatory Visit: Payer: Self-pay

## 2021-04-11 ENCOUNTER — Ambulatory Visit: Payer: Self-pay | Admitting: Physician Assistant

## 2021-04-11 ENCOUNTER — Encounter: Payer: Self-pay | Admitting: Physician Assistant

## 2021-04-11 VITALS — BP 152/100 | HR 103 | Temp 98.1°F | Wt 223.0 lb

## 2021-04-11 DIAGNOSIS — F1090 Alcohol use, unspecified, uncomplicated: Secondary | ICD-10-CM

## 2021-04-11 DIAGNOSIS — I10 Essential (primary) hypertension: Secondary | ICD-10-CM

## 2021-04-11 DIAGNOSIS — R7989 Other specified abnormal findings of blood chemistry: Secondary | ICD-10-CM

## 2021-04-11 DIAGNOSIS — K649 Unspecified hemorrhoids: Secondary | ICD-10-CM

## 2021-04-11 DIAGNOSIS — M4802 Spinal stenosis, cervical region: Secondary | ICD-10-CM

## 2021-04-11 DIAGNOSIS — R062 Wheezing: Secondary | ICD-10-CM

## 2021-04-11 DIAGNOSIS — Z7901 Long term (current) use of anticoagulants: Secondary | ICD-10-CM

## 2021-04-11 DIAGNOSIS — M48061 Spinal stenosis, lumbar region without neurogenic claudication: Secondary | ICD-10-CM

## 2021-04-11 DIAGNOSIS — F17200 Nicotine dependence, unspecified, uncomplicated: Secondary | ICD-10-CM

## 2021-04-11 MED ORDER — PROAIR DIGIHALER 108 (90 BASE) MCG/ACT IN AEPB: 2.0000 | INHALATION_SPRAY | Freq: Four times a day (QID) | RESPIRATORY_TRACT | 0 refills | Status: DC | PRN

## 2021-04-11 NOTE — Patient Instructions (Addendum)
COVID-19 vaccination significantly lowers your risk of severe illness, hospitalization, and death if you get infected. Compared to people who are up to date with their COVID-19 vaccinations, unvaccinated people aremore likely to get COVID-19, much more likely to be hospitalized with COVID-19, and much more likely to die from COVID-19. Like all vaccines, COVID-19 vaccines are not 100% effective at preventing infection. Some people who are up to date with their COVID-19 vaccinations will get COVID-19 breakthrough infection. However, staying up to date with your COVID-19 vaccinations means that you are less likely to have a breakthrough infection and, if you do get sick, you are less likely to get severely ill or die. Staying up to date with COVID-19 vaccination also means you are less likely to spread the disease to others and increases your protection against new variants of SARS-CoV-2, the virus that causes COVID-19.  ------------------------------------------------------------------------  [facility] financial assistance application 2. Chest x-ray 3. Blood tests 4. Stool softener, fiber (like metamucil), preparation H 5. Covid vaccination  -----------------------------------------------  Hemorrhoids Hemorrhoids are swollen veins that may develop: In the butt (rectum). These are called internal hemorrhoids. Around the opening of the butt (anus). These are called external hemorrhoids. Hemorrhoids can cause pain, itching, or bleeding. Most of the time, they do not cause serious problems. They usually get better with diet changes, lifestyle changes, and other home treatments. What are the causes? This condition may be caused by: Having trouble pooping (constipation). Pushing hard (straining) to poop. Watery poop (diarrhea). Pregnancy. Being very overweight (obese). Sitting for long periods of time. Heavy lifting or other activity that causes you to strain. Anal sex. Riding a bike for  a long period of time. What are the signs or symptoms? Symptoms of this condition include: Pain. Itching or soreness in the butt. Bleeding from the butt. Leaking poop. Swelling in the area. One or more lumps around the opening of your butt. How is this diagnosed? A doctor can often diagnose this condition by looking at the affected area. The doctor may also: Do an exam that involves feeling the area with a gloved hand (digital rectal exam). Examine the area inside your butt using a small tube (anoscope). Order blood tests. This may be done if you have lost a lot of blood. Have you get a test that involves looking inside the colon using a flexible tube with a camera on the end (sigmoidoscopy or colonoscopy). How is this treated? This condition can usually be treated at home. Your doctor may tell you to change what you eat, make lifestyle changes, or try home treatments. If these do not help, procedures can be done to remove the hemorrhoids or make them smaller. These may involve: Placing rubber bands at the base of the hemorrhoids to cut off their blood supply. Injecting medicine into the hemorrhoids to shrink them. Shining a type of light energy onto the hemorrhoids to cause them to fall off. Doing surgery to remove the hemorrhoids or cut off their blood supply. Follow these instructions at home: Eating and drinking  Eat foods that have a lot of fiber in them. These include whole grains, beans, nuts, fruits, and vegetables. Ask your doctor about taking products that have added fiber (fibersupplements). Reduce the amount of fat in your diet. You can do this by: Eating low-fat dairy products. Eating less red meat. Avoiding processed foods. Drink enough fluid to keep your pee (urine) pale yellow. Managing pain and swelling  Take a warm-water bath (sitz bath) for 20  minutes to ease pain. Do this 3-4 times a day. You may do this in a bathtub or using a portable sitz bath that fits over  the toilet. If told, put ice on the painful area. It may be helpful to use ice between your warm baths. Put ice in a plastic bag. Place a towel between your skin and the bag. Leave the ice on for 20 minutes, 2-3 times a day. General instructions Take over-the-counter and prescription medicines only as told by your doctor. Medicated creams and medicines may be used as told. Exercise often. Ask your doctor how much and what kind of exercise is best for you. Go to the bathroom when you have the urge to poop. Do not wait. Avoid pushing too hard when you poop. Keep your butt dry and clean. Use wet toilet paper or moist towelettes after pooping. Do not sit on the toilet for a long time. Keep all follow-up visits as told by your doctor. This is important. Contact a doctor if you: Have pain and swelling that do not get better with treatment or medicine. Have trouble pooping. Cannot poop. Have pain or swelling outside the area of the hemorrhoids. Get help right away if you have: Bleeding that will not stop. Summary Hemorrhoids are swollen veins in the butt or around the opening of the butt. They can cause pain, itching, or bleeding. Eat foods that have a lot of fiber in them. These include whole grains, beans, nuts, fruits, and vegetables. Take a warm-water bath (sitz bath) for 20 minutes to ease pain. Do this 3-4 times a day. This information is not intended to replace advice given to you by your health care provider. Make sure you discuss any questions you have with your health care provider. Document Revised: 08/25/2020 Document Reviewed: 08/25/2020 Elsevier Patient Education  [place].

## 2021-04-11 NOTE — Progress Notes (Signed)
BP (!) 152/100    Pulse (!) 103    Temp 98.1 F (36.7 C)    Wt 223 lb (101.2 kg)    SpO2 95%    BMI 32.00 kg/m    Subjective:    Patient ID: Luis Gilbert, male    DOB: 09-24-1965, 56 y.o.   MRN: [id number]  HPI: Luis Gilbert is a 56 y.o. male presenting on 04/11/2021 for Hypertension and Anticoagulation   HPI  Chief Complaint  Patient presents with   Hypertension   Anticoagulation       He has not gotten covid vaccination    He is still smoking and drinks 1- 40oz beers daily which is cut back from 2/day.    He was Referred to neurosurgery.  He had some imaging done in December and  He says he has has  appointment  04/13/21.    CT abdomen done in November was normal.  He says his abdominal pain is gone and he just has back pain now  He says he takes his amlodipine evry day  He says he has had blood in his stool started week.  He says it is more than a tablesppone but less than a cup.  It happens when he moves his bowels.  He has No other signs bleeding- not from gums, no bruising.   He had blood in stool in past.  He says he has had hemorrhoids in the past.   FIT test 01/12/2021 was negative   He has toe pain left foot- 1st MTP- started 2 months ago.  It comes and goes.    He didn't get labs drawn- CMP ordered to recheck LFTs  Pt does not remember submitting [facility] financial assistance application (cafa) given to him.  Check with [facility] confirms he did not complete his application.     Relevant past medical, surgical, family and social history reviewed and updated as indicated. Interim medical history since our last visit reviewed. Allergies and medications reviewed and updated.   Current Outpatient Medications:    amLODipine (NORVASC) 5 MG tablet, Take 1 tablet (5 mg total) by mouth daily., Disp: 90 tablet, Rfl: 0   rivaroxaban (XARELTO) 10 MG TABS tablet, Take 1 tablet (10 mg total) by mouth daily., Disp: 90 tablet, Rfl: 0   Review of Systems  Per  HPI unless specifically indicated above     Objective:    BP (!) 152/100    Pulse (!) 103    Temp 98.1 F (36.7 C)    Wt 223 lb (101.2 kg)    SpO2 95%    BMI 32.00 kg/m   Wt Readings from Last 3 Encounters:  04/11/21 223 lb (101.2 kg)  01/12/21 217 lb (98.4 kg)  12/01/20 221 lb (100.2 kg)    Physical Exam Vitals reviewed. Exam conducted with a chaperone present.  Constitutional:      General: He is not in acute distress.    Appearance: He is well-developed. He is obese. He is not toxic-appearing.  HENT:     Head: Normocephalic and atraumatic.  Cardiovascular:     Rate and Rhythm: Normal rate and regular rhythm.  Pulmonary:     Effort: Pulmonary effort is normal. No respiratory distress.     Breath sounds: Wheezing present.     Comments: LLL exp wheezes Abdominal:     General: Bowel sounds are normal.     Palpations: Abdomen is soft. There is no fluid wave.  Tenderness: There is no abdominal tenderness.     Comments: No tenderness abdomen  Genitourinary:    Rectum: Tenderness and external hemorrhoid present. No anal fissure.     Comments: No thrombosed hemorrhoids.  Pt was not agreeable with internal/rectal exam Tillie Rung chaperone) Musculoskeletal:     Cervical back: Neck supple.     Right lower leg: No edema.     Left lower leg: No edema.  Lymphadenopathy:     Cervical: No cervical adenopathy.  Skin:    General: Skin is warm and dry.  Neurological:     Mental Status: He is alert and oriented to person, place, and time.  Psychiatric:        Attention and Perception: Attention normal.        Speech: Speech normal.        Behavior: Behavior normal. Behavior is cooperative.          Assessment & Plan:    Encounter Diagnoses  Name Primary?   Primary hypertension Yes   Elevated LFTs    Cervical stenosis of spine    Spinal stenosis of lumbar region, unspecified whether neurogenic claudication present    Anticoagulated    Wheezes    Tobacco use disorder     Hemorrhoids, unspecified hemorrhoid type    Heavy alcohol consumption      -pt was educated and Encouraged to get covid vaccination -will Check cbc in addition to the cmp.  Pt is encouraged to get labs done today when he leaves office -will get Cxr in light of new LLL wheezes.  Pt is encouraged to get this today when he leaves office  -encouraged pt to avoid smoking -pt to use Stool softener fiber and prep H.  He is encouraged to increase his water intake.  He is given reading information on hemorrhoids Inhaler/proair sample -will Re-evaluate pt before increasing bp med -pt is given another cafa and encouraged to submit it -pt to follow up 4 weeks.  He is to contact office sooner prn worsening or new symptoms

## 2021-04-14 ENCOUNTER — Ambulatory Visit: Payer: Self-pay | Admitting: Physician Assistant

## 2021-04-15 ENCOUNTER — Other Ambulatory Visit: Payer: Self-pay | Admitting: Physician Assistant

## 2021-04-22 DIAGNOSIS — Z20822 Contact with and (suspected) exposure to covid-19: Secondary | ICD-10-CM | POA: Diagnosis not present

## 2021-04-26 DIAGNOSIS — Z1152 Encounter for screening for COVID-19: Secondary | ICD-10-CM | POA: Diagnosis not present

## 2021-05-09 ENCOUNTER — Ambulatory Visit: Payer: Self-pay | Admitting: Physician Assistant

## 2021-07-15 ENCOUNTER — Other Ambulatory Visit: Payer: Self-pay | Admitting: Physician Assistant

## 2021-08-23 ENCOUNTER — Other Ambulatory Visit (HOSPITAL_COMMUNITY)
Admission: RE | Admit: 2021-08-23 | Discharge: 2021-08-23 | Disposition: A | Discharge status: Home / Self Care | Payer: Self-pay | Attending: Physician Assistant | Admitting: Physician Assistant

## 2021-08-23 DIAGNOSIS — Z7901 Long term (current) use of anticoagulants: Secondary | ICD-10-CM | POA: Insufficient documentation

## 2021-08-23 DIAGNOSIS — I10 Essential (primary) hypertension: Secondary | ICD-10-CM | POA: Insufficient documentation

## 2021-08-23 DIAGNOSIS — R062 Wheezing: Secondary | ICD-10-CM | POA: Insufficient documentation

## 2021-08-23 DIAGNOSIS — R7989 Other specified abnormal findings of blood chemistry: Secondary | ICD-10-CM | POA: Insufficient documentation

## 2021-08-23 LAB — COMPREHENSIVE METABOLIC PANEL
ALT: 96 U/L — ABNORMAL HIGH (ref 0–44)
AST: 85 U/L — ABNORMAL HIGH (ref 15–41)
Albumin: 4 g/dL (ref 3.5–5.0)
Alkaline Phosphatase: 56 U/L (ref 38–126)
Anion gap: 13 (ref 5–15)
BUN: 13 mg/dL (ref 6–20)
CO2: 20 mmol/L — ABNORMAL LOW (ref 22–32)
Calcium: 8.4 mg/dL — ABNORMAL LOW (ref 8.9–10.3)
Chloride: 102 mmol/L (ref 98–111)
Creatinine, Ser: 1.06 mg/dL (ref 0.61–1.24)
GFR, Estimated: >60 mL/min (ref >60)
Glucose, Bld: 92 mg/dL (ref 70–99)
Potassium: 4.2 mmol/L (ref 3.5–5.1)
Sodium: 135 mmol/L (ref 135–145)
Total Bilirubin: 0.7 mg/dL (ref 0.3–1.2)
Total Protein: 8.3 g/dL — ABNORMAL HIGH (ref 6.5–8.1)

## 2021-08-23 LAB — CBC WITH DIFFERENTIAL/PLATELET
Abs Immature Granulocytes: 0.01 10*3/uL (ref 0.00–0.07)
Basophils Absolute: 0 10*3/uL (ref 0.0–0.1)
Basophils Relative: 1 %
Eosinophils Absolute: 0.1 10*3/uL (ref 0.0–0.5)
Eosinophils Relative: 2 %
HCT: 43.6 % (ref 39.0–52.0)
Hemoglobin: 15 g/dL (ref 13.0–17.0)
Immature Granulocytes: 0 %
Lymphocytes Relative: 35 %
Lymphs Abs: 1.2 10*3/uL (ref 0.7–4.0)
MCH: 34.2 pg — ABNORMAL HIGH (ref 26.0–34.0)
MCHC: 34.4 g/dL (ref 30.0–36.0)
MCV: 99.5 fL (ref 80.0–100.0)
Monocytes Absolute: 0.4 10*3/uL (ref 0.1–1.0)
Monocytes Relative: 11 %
Neutro Abs: 1.8 10*3/uL (ref 1.7–7.7)
Neutrophils Relative %: 51 %
Platelets: 188 10*3/uL (ref 150–400)
RBC: 4.38 MIL/uL (ref 4.22–5.81)
RDW: 12.2 % (ref 11.5–15.5)
WBC: 3.5 10*3/uL — ABNORMAL LOW (ref 4.0–10.5)
nRBC: 0 % (ref 0.0–0.2)

## 2021-08-24 ENCOUNTER — Encounter: Payer: Self-pay | Admitting: Physician Assistant

## 2021-08-24 ENCOUNTER — Ambulatory Visit: Payer: Self-pay | Admitting: Physician Assistant

## 2021-08-24 VITALS — BP 136/88 | HR 105 | Temp 98.0°F | Wt 217.5 lb

## 2021-08-24 DIAGNOSIS — R4020 Unspecified coma: Secondary | ICD-10-CM

## 2021-08-24 DIAGNOSIS — K760 Fatty (change of) liver, not elsewhere classified: Secondary | ICD-10-CM

## 2021-08-24 DIAGNOSIS — G8929 Other chronic pain: Secondary | ICD-10-CM

## 2021-08-24 DIAGNOSIS — I10 Essential (primary) hypertension: Secondary | ICD-10-CM

## 2021-08-24 DIAGNOSIS — J449 Chronic obstructive pulmonary disease, unspecified: Secondary | ICD-10-CM

## 2021-08-24 DIAGNOSIS — I2510 Atherosclerotic heart disease of native coronary artery without angina pectoris: Secondary | ICD-10-CM

## 2021-08-24 DIAGNOSIS — F1090 Alcohol use, unspecified, uncomplicated: Secondary | ICD-10-CM

## 2021-08-24 DIAGNOSIS — F17200 Nicotine dependence, unspecified, uncomplicated: Secondary | ICD-10-CM

## 2021-08-24 DIAGNOSIS — M4802 Spinal stenosis, cervical region: Secondary | ICD-10-CM

## 2021-08-24 DIAGNOSIS — M48061 Spinal stenosis, lumbar region without neurogenic claudication: Secondary | ICD-10-CM

## 2021-08-24 DIAGNOSIS — R7989 Other specified abnormal findings of blood chemistry: Secondary | ICD-10-CM

## 2021-08-24 MED ORDER — RIVAROXABAN 10 MG PO TABS: 10.0000 mg | ORAL_TABLET | Freq: Every day | ORAL | 0 refills | Status: DC

## 2021-08-24 MED ORDER — PROAIR DIGIHALER 108 (90 BASE) MCG/ACT IN AEPB: 2.0000 | INHALATION_SPRAY | Freq: Four times a day (QID) | RESPIRATORY_TRACT | 0 refills | Status: DC | PRN

## 2021-08-24 MED ORDER — FLUTICASONE-SALMETEROL 100-50 MCG/ACT IN AEPB: 1.0000 | INHALATION_SPRAY | Freq: Two times a day (BID) | RESPIRATORY_TRACT | 0 refills | Status: DC

## 2021-08-24 MED ORDER — AMLODIPINE BESYLATE 5 MG PO TABS: 5.0000 mg | ORAL_TABLET | Freq: Every day | ORAL | 0 refills | Status: DC

## 2021-08-24 NOTE — Progress Notes (Signed)
BP 136/88   Pulse (!) 105   Temp 98 F (36.7 C)   Wt 217 lb 8 oz (98.7 kg)   SpO2 95%   BMI 31.21 kg/m    Subjective:    Patient ID: Luis Gilbert, male    DOB: 1965/11/19, 56 y.o.   MRN: [id number]  HPI: Luis Gilbert is a 56 y.o. male presenting on 08/24/2021 for Follow-up   HPI    Pt is 14yoM who returns to office today.  His last appointment was in February.  He was a no-show to his March appointment.   Pt went to neurosurgery in February.  He recommended physical therapy and gave a referral to pain management.  pt has been out of his meds for months.     He says he gets to coughing and it causes him to black out.  It happened 3 times this week.  He only stays out for a couple of seconds.  He says he only blacks out when he is coughing.   He says it happens often.   These episodes have been witnessed by his wife.  Pt says there is no shaking or seizure-like activity.    He says no other new issues.     He had a nosebleed once.  It happened during his sleep.  It just bled a little bit.     Pt had been on xarelto due to multiple PE (during one instance)and had elevated homocysteine on testing.  He says he is smoking 1/2 ppd and drinking 1- 40oz beer day and smoking MJ.    He says he is not driving.  Pt says he is not having any chest pains.   Pt has questionable history of CAD- initial diagnosis of NSTEMI in 2021 was later felt to be due to multiple PE, not MI.      Relevant past medical, surgical, family and social history reviewed and updated as indicated. Interim medical history since our last visit reviewed. Allergies and medications reviewed and updated.  CURRENT MEDS: None  Previous meds: Albuterol MDI Xarelto 10mg  qd Amlodipine 5mg       Review of Systems  Per HPI unless specifically indicated above     Objective:    BP 136/88   Pulse (!) 105   Temp 98 F (36.7 C)   Wt 217 lb 8 oz (98.7 kg)   SpO2 95%   BMI 31.21 kg/m   Wt Readings  from Last 3 Encounters:  08/24/21 217 lb 8 oz (98.7 kg)  04/11/21 223 lb (101.2 kg)  01/12/21 217 lb (98.4 kg)    Physical Exam Vitals reviewed.  Constitutional:      General: He is not in acute distress.    Appearance: He is well-developed. He is obese. He is not toxic-appearing.  HENT:     Head: Normocephalic and atraumatic.  Eyes:     Extraocular Movements: Extraocular movements intact.     Conjunctiva/sclera: Conjunctivae normal.     Pupils: Pupils are equal, round, and reactive to light.  Cardiovascular:     Rate and Rhythm: Normal rate and regular rhythm.  Pulmonary:     Effort: Pulmonary effort is normal.     Breath sounds: Normal breath sounds. No wheezing.  Abdominal:     General: Bowel sounds are normal.     Palpations: Abdomen is soft.     Tenderness: There is no abdominal tenderness.  Musculoskeletal:     Cervical back: Neck supple.  Right lower leg: No edema.     Left lower leg: No edema.  Lymphadenopathy:     Cervical: No cervical adenopathy.  Skin:    General: Skin is warm and dry.  Neurological:     Mental Status: He is alert and oriented to person, place, and time.     Motor: No weakness or tremor.  Psychiatric:        Attention and Perception: Attention normal.        Behavior: Behavior normal. Behavior is cooperative.     Results for orders placed or performed during the hospital encounter of 08/23/21  Comprehensive metabolic panel  Result Value Ref Range   Sodium 135 135 - 145 mmol/L   Potassium 4.2 3.5 - 5.1 mmol/L   Chloride 102 98 - 111 mmol/L   CO2 20 (L) 22 - 32 mmol/L   Glucose, Bld 92 70 - 99 mg/dL   BUN 13 6 - 20 mg/dL   Creatinine, Ser 1.06 0.61 - 1.24 mg/dL   Calcium 8.4 (L) 8.9 - 10.3 mg/dL   Total Protein 8.3 (H) 6.5 - 8.1 g/dL   Albumin 4.0 3.5 - 5.0 g/dL   AST 85 (H) 15 - 41 U/L   ALT 96 (H) 0 - 44 U/L   Alkaline Phosphatase 56 38 - 126 U/L   Total Bilirubin 0.7 0.3 - 1.2 mg/dL   GFR, Estimated >60 >60 mL/min   Anion gap  13 5 - 15  CBC w/Diff/Platelet  Result Value Ref Range   WBC 3.5 (L) 4.0 - 10.5 K/uL   RBC 4.38 4.22 - 5.81 MIL/uL   Hemoglobin 15.0 13.0 - 17.0 g/dL   HCT 43.6 39.0 - 52.0 %   MCV 99.5 80.0 - 100.0 fL   MCH 34.2 (H) 26.0 - 34.0 pg   MCHC 34.4 30.0 - 36.0 g/dL   RDW 12.2 11.5 - 15.5 %   Platelets 188 150 - 400 K/uL   nRBC 0.0 0.0 - 0.2 %   Neutrophils Relative % 51 %   Neutro Abs 1.8 1.7 - 7.7 K/uL   Lymphocytes Relative 35 %   Lymphs Abs 1.2 0.7 - 4.0 K/uL   Monocytes Relative 11 %   Monocytes Absolute 0.4 0.1 - 1.0 K/uL   Eosinophils Relative 2 %   Eosinophils Absolute 0.1 0.0 - 0.5 K/uL   Basophils Relative 1 %   Basophils Absolute 0.0 0.0 - 0.1 K/uL   Immature Granulocytes 0 %   Abs Immature Granulocytes 0.01 0.00 - 0.07 K/uL    EKG- NSR at 98bpm.  No changes compared with EKG from 09/28/20       Assessment & Plan:   Encounter Diagnoses  Name Primary?   Primary hypertension Yes   LOC (loss of consciousness) ([place])    Coronary artery disease involving native heart without angina pectoris, unspecified vessel or lesion type    Chronic obstructive pulmonary disease, unspecified COPD type (H[facility])    Elevated LFTs    Chronic midline low back pain without sciatica    Spinal stenosis of lumbar region, unspecified whether neurogenic claudication present    Cervical stenosis of spine    Tobacco use disorder    Heavy alcohol consumption    Hepatic steatosis      -reviewed labs with pt -will refer to cardiology for further evaluation of syncope -will work on referral to PT as ordered by neurosurgery -will check on pt's financial assistance status -pt was encouraged to stop smoking.  Discussed  that his LOC may stop if his coughing improved.  He was given proair and wixela.  Rx for amlodipine and xarelto sent to pharmacy -pt was encouraged to stop drinking.  Discussed that LFTs are still elevated  -agreed with pt that he should avoid driving at this time -pt to follow up 4  wk.  He is to contact office sooner for wornseing or new symtpoms

## 2021-08-24 NOTE — Progress Notes (Signed)
LPN called and notified pt that he currently has no pending applications for financial assistance for [facility] or [facility]. Pt was advised to contact [facility] for assistance with this in order to have his referrals processed. Pt verbalized understanding and states [facility] has already contacted him and scheduled him an appt to submit paperwork.

## 2021-08-24 NOTE — Patient Instructions (Addendum)
-we will let you know about status of your [employer] financial assistance and your [facility] financial assistance  -use fluticasone inhaler daily and albuterol inhaler as needed  - you should stop smoking cigarettes and marijuana  -you should stop drinking alcohol  -we are referring you to cardiologist for evaluation of black out spells  -we are referring you to physical therapy as recommended by neurosurgeon  -your medications should come in the mail to you within the next week or two  -----------------------------------------------------   Syncope, Adult  Syncope refers to a condition in which a person temporarily loses consciousness. Syncope may also be called fainting or passing out. It is caused by a sudden decrease in blood flow to the brain. This can happen for a variety of reasons. Most causes of syncope are not dangerous. It can be triggered by things such as needle sticks, seeing blood, pain, or intense emotion. However, syncope can also be a sign of a serious medical problem, such as a heart abnormality. Other causes can include dehydration, migraines, or taking medicines that lower blood pressure. Your health care provider may do tests to find the reason why you are having syncope. If you faint, get medical help right away. Call your local emergency services (911 in the [place] Follow these instructions at home: Pay attention to any changes in your symptoms. Take these actions to stay safe and to help relieve your symptoms: Knowing when you may be about to faint Signs that you may be about to faint include: Feeling dizzy, weak, light-headed, or like the room is spinning. Feeling nauseous. Seeing spots or seeing all white or all black in your field of vision. Having cold, clammy skin or feeling warm and sweaty. Hearing ringing in the ears (tinnitus). If you start to feel like you might faint, sit or lie down right away. If sitting, put your head down between your legs. If lying  down, raise (elevate) your feet above the level of your heart. Breathe deeply and steadily. Wait until all the symptoms have passed. Have someone stay with you until you feel stable. Medicines Take over-the-counter and prescription medicines only as told by your health care provider. If you are taking blood pressure or heart medicine, get up slowly and take several minutes to sit and then stand. This can reduce dizziness and decrease the risk of syncope. Lifestyle Do not drive, use machinery, or play sports until your health care provider says it is okay. Do not drink alcohol. Do not use any products that contain nicotine or tobacco. These products include cigarettes, chewing tobacco, and vaping devices, such as e-cigarettes. If you need help quitting, ask your health care provider. Avoid hot tubs and saunas. General instructions Talk with your health care provider about your symptoms. You may need to have testing to understand the cause of your syncope. Drink enough fluid to keep your urine pale yellow. Avoid prolonged standing. If you must stand for a long time, do movements such as: Moving your legs. Crossing your legs. Flexing and stretching your leg muscles. Squatting. Keep all follow-up visits. This is important. Contact a health care provider if: You have episodes of near fainting. Get help right away if: You faint. You hit your head or are injured after fainting. You have any of these symptoms that may indicate trouble with your heart: Fast or irregular heartbeats (palpitations). Unusual pain in your chest, abdomen, or back. Shortness of breath. You have a seizure. You have a severe headache. You are  confused. You have vision problems. You have severe weakness or trouble walking. You are bleeding from your mouth or rectum, or you have black or tarry stool. These symptoms may represent a serious problem that is an emergency. Do not wait to see if your symptoms will go away.  Get medical help right away. Call your local emergency services (911 in the [place] Do not drive yourself to the hospital. Summary Syncope refers to a condition in which a person temporarily loses consciousness. Syncope may also be called fainting or passing out. It is caused by a sudden decrease in blood flow to the brain. Signs that you may be about to faint include dizziness, feeling light-headed, feeling nauseous, sudden vision changes, or cold, clammy skin. Even though most causes of syncope are not dangerous, syncope can be a sign of a serious medical problem. Get help right away if you faint. If you start to feel like you might faint, sit or lie down right away. If sitting, put your head down between your legs. If lying down, raise (elevate) your feet above the level of your heart. This information is not intended to replace advice given to you by your health care provider. Make sure you discuss any questions you have with your health care provider. Document Revised: 06/24/2020 Document Reviewed: 06/24/2020 Elsevier Patient Education  [place].

## 2021-09-06 ENCOUNTER — Encounter: Payer: Self-pay | Admitting: Physician Assistant

## 2021-09-19 ENCOUNTER — Other Ambulatory Visit: Payer: Self-pay | Admitting: Physician Assistant

## 2021-09-19 DIAGNOSIS — M48061 Spinal stenosis, lumbar region without neurogenic claudication: Secondary | ICD-10-CM

## 2021-09-19 DIAGNOSIS — M545 Low back pain, unspecified: Secondary | ICD-10-CM

## 2021-09-19 DIAGNOSIS — M4802 Spinal stenosis, cervical region: Secondary | ICD-10-CM

## 2021-09-20 ENCOUNTER — Ambulatory Visit: Payer: Self-pay | Admitting: Physician Assistant

## 2021-10-03 ENCOUNTER — Telehealth: Payer: Self-pay | Admitting: Physician Assistant

## 2021-10-03 NOTE — Telephone Encounter (Signed)
Contacted [facility] and cancelled referral as pt has insurance now and may need pre-authorization.    Attempted to notify pt but was unable to contact

## 2021-11-17 ENCOUNTER — Ambulatory Visit: Payer: Medicaid Other | Attending: Cardiology | Admitting: Cardiology

## 2021-11-17 ENCOUNTER — Encounter: Payer: Self-pay | Admitting: Cardiology

## 2021-11-17 VITALS — BP 142/85 | HR 93 | Ht 69.0 in | Wt 228.4 lb

## 2021-11-17 DIAGNOSIS — R55 Syncope and collapse: Secondary | ICD-10-CM | POA: Diagnosis not present

## 2021-11-17 DIAGNOSIS — R0602 Shortness of breath: Secondary | ICD-10-CM

## 2021-11-17 DIAGNOSIS — I2510 Atherosclerotic heart disease of native coronary artery without angina pectoris: Secondary | ICD-10-CM | POA: Diagnosis not present

## 2021-11-17 MED ORDER — ATORVASTATIN CALCIUM 20 MG PO TABS: 20.0000 mg | ORAL_TABLET | Freq: Every day | ORAL | 3 refills | Status: DC

## 2021-11-17 NOTE — Patient Instructions (Signed)
Medication Instructions:  Your physician has recommended you make the following change in your medication:   -Start Lipitor (Atorvastatin) 20 mg tablets daily   Labwork: None  Testing/Procedures: Your physician has requested that you have an echocardiogram. Echocardiography is a painless test that uses sound waves to create images of your heart. It provides your doctor with information about the size and shape of your heart and how well your heart's chambers and valves are working. This procedure takes approximately one hour. There are no restrictions for this procedure.   Follow-Up: Follow up is pending test results.   Any Other Special Instructions Will Be Listed Below (If Applicable).     If you need a refill on your cardiac medications before your next appointment, please call your pharmacy.

## 2021-11-17 NOTE — Progress Notes (Signed)
2     Clinical Summary Luis Gilbert is a 56 y.o.male seen as a new consult, referred by PA [facility] for the following medical problems.     1.Syncope - chronic cough x 2 years.  - heavy coughing spell, feels lightheaded, dizzy. Can pass out.  - last episode 1 week - history of tobacco x 20 years. Has inhalers home.    2.History of PE in 2021 - on xarelto, compliant - appears on lifelong anticoag   3. EtOH abuse   4. Aortic atherosclerosis - no chest pains. - 12/2020 LDL 80  5. LE edema - leg edema off and on over the last several years - swelling on both sides, comes and goes - some SOB/DOE - swelling ongiopng prior to start norvasc.   6. HTN - allergic to lisinopril - he is on norvasc, has not taken bp med yet.     Past Medical History:  Diagnosis Date   Hypertension    PE (pulmonary thromboembolism) ([place]) 04/2019     Allergies  Allergen Reactions   Tomato Swelling   Lisinopril     Other reaction(s): lip swelling     Current Outpatient Medications  Medication Sig Dispense Refill   Albuterol Sulfate, sensor, (PROAIR DIGIHALER) 108 (90 Base) MCG/ACT AEPB Inhale 2 puffs into the lungs every 6 (six) hours as needed. 3 each 0   amLODipine (NORVASC) 5 MG tablet Take 1 tablet (5 mg total) by mouth daily. 90 tablet 0   atorvastatin (LIPITOR) 20 MG tablet Take 1 tablet (20 mg total) by mouth daily. 90 tablet 3   fluticasone-salmeterol (WIXELA INHUB) 100-50 MCG/ACT AEPB Inhale 1 puff into the lungs 2 (two) times daily. 3 each 0   nitroGLYCERIN (NITROSTAT) 0.4 MG SL tablet as neede for chest pain; may repeat q 5 min x 3 prn; go to ED if still having chest pain     rivaroxaban (XARELTO) 10 MG TABS tablet Take 1 tablet (10 mg total) by mouth daily. 90 tablet 0   No current facility-administered medications for this visit.     Past Surgical History:  Procedure Laterality Date   ARM SURGERY       Allergies  Allergen Reactions   Tomato Swelling   Lisinopril      Other reaction(s): lip swelling      Family History  Problem Relation Age of Onset   Cancer Mother    Cancer Father    Cancer Maternal Uncle    Hypertension Maternal Uncle    Cancer Paternal Uncle    Hypertension Paternal Uncle      Social History Luis Gilbert reports that he has been smoking cigarettes. He has been smoking an average of .25 packs per day. He has never used smokeless tobacco. Luis Gilbert reports current alcohol use.   Review of Systems CONSTITUTIONAL: No weight loss, fever, chills, weakness or fatigue.  HEENT: Eyes: No visual loss, blurred vision, double vision or yellow sclerae.No hearing loss, sneezing, congestion, runny nose or sore throat.  SKIN: No rash or itching.  CARDIOVASCULAR: per hpi RESPIRATORY: per hpi GASTROINTESTINAL: No anorexia, nausea, vomiting or diarrhea. No abdominal pain or blood.  GENITOURINARY: No burning on urination, no polyuria NEUROLOGICAL: No headache, dizziness, syncope, paralysis, ataxia, numbness or tingling in the extremities. No change in bowel or bladder control.  MUSCULOSKELETAL: No muscle, back pain, joint pain or stiffness.  LYMPHATICS: No enlarged nodes. No history of splenectomy.  PSYCHIATRIC: No history of depression or anxiety.  ENDOCRINOLOGIC: No reports  of sweating, cold or heat intolerance. No polyuria or polydipsia.  Marland Kitchen   Physical Examination Today's Vitals   11/17/21 1456  BP: (!) 142/88  Pulse: 93  SpO2: 96%  Weight: 228 lb 6.4 oz (103.6 kg)  Height: 5\' 9"  (1.753 m)   Body mass index is 33.73 kg/m.  Gen: resting comfortably, no acute distress HEENT: no scleral icterus, pupils equal round and reactive, no palptable cervical adenopathy,  CV: RRR, no mr/ gno jvd Resp: Clear to auscultation bilaterally GI: abdomen is soft, non-tender, non-distended, normal bowel sounds, no hepatosplenomegaly MSK: extremities are warm, trace bilateral edema Skin: warm, no rash Neuro:  no focal deficits Psych:  appropriate affect   Diagnostic Studies  04/2019 echo 1. Left ventricular ejection fraction, by estimation, is 55 to 60%. The  left ventricle has normal function. The left ventricle has no regional  wall motion abnormalities. There is mild concentric left ventricular  hypertrophy. Left ventricular diastolic  parameters are consistent with Grade I diastolic dysfunction (impaired  relaxation).   2. The right ventricular free wall is mildly hypokinetic, but the apex  contracts normally (McConnell's sign), consistent with acute pulmonary  embolism. Right ventricular systolic function is mildly reduced. The right  ventricular size is mildly enlarged.   There is mildly elevated pulmonary artery systolic pressure. The  estimated right ventricular systolic pressure is 15.8 mmHg.   3. Right atrial size was mildly dilated.   4. The mitral valve is normal in structure and function. No evidence of  mitral valve regurgitation.   5. The aortic valve is normal in structure and function. Aortic valve  regurgitation is not visualized.   6. The inferior vena cava is normal in size with greater than 50%  respiratory variability, suggesting right atrial pressure of 3 mmHg.    Assessment and Plan  1.Postussive syncope - not a cardiac condition, related to hemodynamic and vasovagal response of severe coughing spells - no cardiac testing indicated, discussed pulm eval for the cough but he turns down at this time  2. Coronary atherosclerosis - noted on CT scan - no specific symptoms - no ASA since on xarelto, start statin in setting of atherosclerosis despite LDL of 80 for pleiotropic effects   3. LE edema/SOB - obtain echo. Pending results perhaps low dose prn diuretic - reports swelling started before being on norvasc  4. HTN - elevated here but has not taken meds yet, monitor at this time   F/u pending echo      Arnoldo Lenis, M.D.,

## 2021-11-18 ENCOUNTER — Ambulatory Visit: Payer: Self-pay | Admitting: Internal Medicine

## 2021-11-23 ENCOUNTER — Ambulatory Visit (HOSPITAL_COMMUNITY)
Admission: RE | Admit: 2021-11-23 | Discharge: 2021-11-23 | Disposition: A | Discharge status: Home / Self Care | Payer: Medicaid Other | Source: Ambulatory Visit | Attending: Cardiology | Admitting: Cardiology

## 2021-11-23 DIAGNOSIS — R0602 Shortness of breath: Secondary | ICD-10-CM | POA: Diagnosis present

## 2021-11-23 LAB — ECHOCARDIOGRAM COMPLETE
AR max vel: 2.73 cm2
AV Area VTI: 2.53 cm2
AV Area mean vel: 3.11 cm2
AV Mean grad: 3 mmHg
AV Peak grad: 6.5 mmHg
Ao pk vel: 1.27 m/s
Area-P 1/2: 3.13 cm2
MV VTI: 3.1 cm2
S' Lateral: 3.7 cm

## 2021-11-23 NOTE — Progress Notes (Signed)
*  PRELIMINARY RESULTS* Echocardiogram 2D Echocardiogram has been performed.  Luis Gilbert 11/23/2021, 1:50 PM

## 2021-11-28 ENCOUNTER — Ambulatory Visit: Payer: Medicaid Other | Admitting: Internal Medicine

## 2021-11-28 ENCOUNTER — Encounter: Payer: Self-pay | Admitting: Internal Medicine

## 2021-11-28 VITALS — BP 138/86 | HR 89 | Resp 18 | Ht 69.5 in | Wt 230.0 lb

## 2021-11-28 DIAGNOSIS — I2609 Other pulmonary embolism with acute cor pulmonale: Secondary | ICD-10-CM

## 2021-11-28 DIAGNOSIS — Z1321 Encounter for screening for nutritional disorder: Secondary | ICD-10-CM

## 2021-11-28 DIAGNOSIS — R7401 Elevation of levels of liver transaminase levels: Secondary | ICD-10-CM | POA: Insufficient documentation

## 2021-11-28 DIAGNOSIS — I10 Essential (primary) hypertension: Secondary | ICD-10-CM | POA: Diagnosis not present

## 2021-11-28 DIAGNOSIS — E782 Mixed hyperlipidemia: Secondary | ICD-10-CM

## 2021-11-28 DIAGNOSIS — M545 Low back pain, unspecified: Secondary | ICD-10-CM | POA: Insufficient documentation

## 2021-11-28 DIAGNOSIS — Z7689 Persons encountering health services in other specified circumstances: Secondary | ICD-10-CM | POA: Insufficient documentation

## 2021-11-28 DIAGNOSIS — Z720 Tobacco use: Secondary | ICD-10-CM | POA: Insufficient documentation

## 2021-11-28 DIAGNOSIS — G8929 Other chronic pain: Secondary | ICD-10-CM

## 2021-11-28 DIAGNOSIS — E785 Hyperlipidemia, unspecified: Secondary | ICD-10-CM | POA: Insufficient documentation

## 2021-11-28 DIAGNOSIS — Z1212 Encounter for screening for malignant neoplasm of rectum: Secondary | ICD-10-CM

## 2021-11-28 DIAGNOSIS — Z1159 Encounter for screening for other viral diseases: Secondary | ICD-10-CM

## 2021-11-28 DIAGNOSIS — Z2821 Immunization not carried out because of patient refusal: Secondary | ICD-10-CM

## 2021-11-28 DIAGNOSIS — Z131 Encounter for screening for diabetes mellitus: Secondary | ICD-10-CM

## 2021-11-28 DIAGNOSIS — Z13220 Encounter for screening for lipoid disorders: Secondary | ICD-10-CM | POA: Diagnosis not present

## 2021-11-28 DIAGNOSIS — Z1211 Encounter for screening for malignant neoplasm of colon: Secondary | ICD-10-CM

## 2021-11-28 DIAGNOSIS — Z8709 Personal history of other diseases of the respiratory system: Secondary | ICD-10-CM | POA: Insufficient documentation

## 2021-11-28 MED ORDER — RIVAROXABAN 10 MG PO TABS
10.0000 mg | ORAL_TABLET | Freq: Every day | ORAL | 0 refills | Status: DC
Start: 2021-11-28 — End: 2022-02-28

## 2021-11-28 MED ORDER — CHLORTHALIDONE 25 MG PO TABS: 25.0000 mg | ORAL_TABLET | Freq: Every day | ORAL | 2 refills | Status: DC

## 2021-11-28 NOTE — Assessment & Plan Note (Signed)
Currently prescribed amlodipine 5 mg daily.  BP 138/86 today.  Today he endorses bilateral ankle pain and swelling.  He has a chronic history of ankle swelling that precedes initiation of amlodipine, however he states that his swelling has never been this severe.  On exam he has nonpitting bilateral lower extremity edema radiating proximally to the mid shin. -Through shared decision making, Luis Gilbert would like to switch antihypertensive medications to ensure her amlodipine is not contributing to his lower extremity edema.  I have prescribed chlorthalidone 25 mg daily today.  We will plan for follow-up in 2 weeks for BP check.  Amlodipine has been discontinued.

## 2021-11-28 NOTE — Assessment & Plan Note (Signed)
Presenting today to establish care.  Previous records reviewed. -Baseline labs ordered today, including one-time HCV screening -He declined influenza and COVID-19 vaccines today -He will consider receiving the Shingrix vaccine at his follow-up appointment in 2 weeks -Cologuard ordered today for colorectal cancer screening

## 2021-11-28 NOTE — Assessment & Plan Note (Signed)
He endorses a history of chronic low back pain today.  He was evaluated earlier this year by neurosurgery at [facility] and referred to PT and pain management.  He did not establish care with pain management or attend physical therapy due to cost and travel distance to travel physical.  He now has Medicaid and request new referral today. -Referrals placed to both pain management and physical therapy

## 2021-11-28 NOTE — Assessment & Plan Note (Signed)
Currently smokes 0.5 packs/day of cigarettes.  He has been smoking for 20 years.  He knows that he needs to quit and declines NRT products today. -The patient was counseled on the dangers of tobacco use, and was advised to quit.  Reviewed strategies to maximize success, including removing cigarettes and smoking materials from environment, stress management, substitution of other forms of reinforcement, support of family/friends and written materials.

## 2021-11-28 NOTE — Patient Instructions (Signed)
It was a pleasure to see you today.  Thank you for giving [place] the opportunity to be involved in your care.  Below is a brief recap of your visit and next steps.  We will plan to see you again in 2 weeks.  Summary I have prescribed chlorthalidone today for high blood pressure. Stop taking amlodipine. We will follow up in 2 weeks for a blood pressure check and labs. I have referred you to pain management and physical therapy for your back pain I have ordered Cologuard today for colon cancer screening.  Next steps Follow up in 2 weeks for BP check I will notify you of lab results.

## 2021-11-28 NOTE — Progress Notes (Signed)
New Patient Office Visit  Subjective    Patient ID: Luis Gilbert, male    DOB: Oct 22, 1965  Age: 56 y.o. MRN: [id number]  CC:  Chief Complaint  Patient presents with   New Patient (Initial Visit)    New patient both legs and ankles have been swelling for 3 weeks with pain relaxing doesn't help this     HPI Luis Gilbert presents to establish care.  He is a 57 year old male with a past medical history significant for HTN, HLD, prior PE (currently on Xarelto), asthma, chronic low back pain, current tobacco use, and alcohol use.  He was previously followed at the free clinic of [facility]    Luis Gilbert is accompanied by his wife today.  His acute concern is bilateral pain and swelling in his ankles.  He endorses chronic swelling of his ankles but states that is not typically this bad.  Has been present for the last 2-3 weeks and typically resolves with leg elevation, however despite usual measures his symptoms have persisted.  He is currently prescribed amlodipine 5 mg daily and states that he has been on this medication for several years.  He states again that ankle swelling precedes starting amlodipine, but has never been this bad.  Luis Gilbert is otherwise asymptomatic.  Acute concerns, chronic medical conditions, and outstanding preventative healthcare maintenance items discussed today are individually addressed in A/P below.  Outpatient Encounter Medications as of 11/28/2021  Medication Sig   Albuterol Sulfate, sensor, (PROAIR DIGIHALER) 108 (90 Base) MCG/ACT AEPB Inhale 2 puffs into the lungs every 6 (six) hours as needed.   atorvastatin (LIPITOR) 20 MG tablet Take 1 tablet (20 mg total) by mouth daily.   chlorthalidone (HYGROTON) 25 MG tablet Take 1 tablet (25 mg total) by mouth daily.   fluticasone-salmeterol (WIXELA INHUB) 100-50 MCG/ACT AEPB Inhale 1 puff into the lungs 2 (two) times daily.   nitroGLYCERIN (NITROSTAT) 0.4 MG SL tablet as neede for chest pain; may repeat q 5 min  x 3 prn; go to ED if still having chest pain   [DISCONTINUED] amLODipine (NORVASC) 5 MG tablet Take 1 tablet (5 mg total) by mouth daily.   [DISCONTINUED] rivaroxaban (XARELTO) 10 MG TABS tablet Take 1 tablet (10 mg total) by mouth daily.   rivaroxaban (XARELTO) 10 MG TABS tablet Take 1 tablet (10 mg total) by mouth daily.   No facility-administered encounter medications on file as of 11/28/2021.    Past Medical History:  Diagnosis Date   Hypertension    PE (pulmonary thromboembolism) ([place]) 04/2019    Past Surgical History:  Procedure Laterality Date   ARM SURGERY      Family History  Problem Relation Age of Onset   Cancer Mother    Cancer Father    Cancer Maternal Uncle    Hypertension Maternal Uncle    Cancer Paternal Uncle    Hypertension Paternal Uncle     Social History   Socioeconomic History   Marital status: Married    Spouse name: Not on file   Number of children: Not on file   Years of education: Not on file   Highest education level: Not on file  Occupational History   Not on file  Tobacco Use   Smoking status: Every Day    Packs/day: 0.25    Types: Cigarettes   Smokeless tobacco: Never  Vaping Use   Vaping Use: Never used  Substance and Sexual Activity   Alcohol use: Yes    Comment:  drinks 1- 40 oz daily   Drug use: Yes    Types: Marijuana    Comment: MJ qod.  no cocaine since 04/2019   Sexual activity: Yes  Other Topics Concern   Not on file  Social History Narrative   Not on file   Social Determinants of Health   Financial Resource Strain: Not on file  Food Insecurity: Not on file  Transportation Needs: Not on file  Physical Activity: Not on file  Stress: Not on file  Social Connections: Not on file  Intimate Partner Violence: Not on file   Review of Systems  Cardiovascular:  Positive for leg swelling (Bilateral ankle swelling).  Musculoskeletal:  Positive for back pain (Chronic lumbar back pain).  All other systems reviewed and are  negative.  Objective    BP 138/86 (BP Location: Right Arm, Patient Position: Sitting, Cuff Size: Normal)   Pulse 89   Resp 18   Ht 5' 9.5" (1.765 m)   Wt 230 lb (104.3 kg)   SpO2 97%   BMI 33.48 kg/m   Physical Exam Vitals reviewed.  Constitutional:      General: He is not in acute distress.    Appearance: Normal appearance. He is obese. He is not ill-appearing.  HENT:     Head: Normocephalic and atraumatic.     Nose: Nose normal. No congestion or rhinorrhea.     Mouth/Throat:     Mouth: Mucous membranes are moist.     Pharynx: Oropharynx is clear.  Eyes:     Extraocular Movements: Extraocular movements intact.     Conjunctiva/sclera: Conjunctivae normal.     Pupils: Pupils are equal, round, and reactive to light.  Cardiovascular:     Rate and Rhythm: Normal rate and regular rhythm.     Pulses: Normal pulses.     Heart sounds: Normal heart sounds. No murmur heard. Pulmonary:     Effort: Pulmonary effort is normal.     Breath sounds: Normal breath sounds. No wheezing, rhonchi or rales.  Abdominal:     General: Abdomen is flat. Bowel sounds are normal. There is no distension.     Palpations: Abdomen is soft.     Tenderness: There is no abdominal tenderness.  Musculoskeletal:        General: No swelling or deformity. Normal range of motion.     Cervical back: Normal range of motion.     Right lower leg: Edema present.     Left lower leg: Edema present.     Comments: There is nonpitting bilateral lower extremity edema around the ankles radiating proximally to the mid shin  Skin:    General: Skin is warm and dry.     Capillary Refill: Capillary refill takes less than 2 seconds.  Neurological:     General: No focal deficit present.     Mental Status: He is alert and oriented to person, place, and time.     Motor: No weakness.  Psychiatric:        Mood and Affect: Mood normal.        Behavior: Behavior normal.        Thought Content: Thought content normal.    Last  CBC Lab Results  Component Value Date   WBC 3.5 (L) 08/23/2021   HGB 15.0 08/23/2021   HCT 43.6 08/23/2021   MCV 99.5 08/23/2021   MCH 34.2 (H) 08/23/2021   RDW 12.2 08/23/2021   PLT 188 14/97/0263   Last metabolic panel Lab Results  Component Value Date   GLUCOSE 92 08/23/2021   NA 135 08/23/2021   K 4.2 08/23/2021   CL 102 08/23/2021   CO2 20 (L) 08/23/2021   BUN 13 08/23/2021   CREATININE 1.06 08/23/2021   GFRNONAA >60 08/23/2021   CALCIUM 8.4 (L) 08/23/2021   PHOS 5.3 (H) 05/02/2019   PROT 8.3 (H) 08/23/2021   ALBUMIN 4.0 08/23/2021   BILITOT 0.7 08/23/2021   ALKPHOS 56 08/23/2021   AST 85 (H) 08/23/2021   ALT 96 (H) 08/23/2021   ANIONGAP 13 08/23/2021   Last lipids Lab Results  Component Value Date   CHOL 157 12/31/2020   HDL 69 12/31/2020   LDLCALC 80 12/31/2020   TRIG 41 12/31/2020   CHOLHDL 2.3 12/31/2020   Last hemoglobin A1c Lab Results  Component Value Date   HGBA1C 5.6 12/31/2020   Assessment & Plan:   Problem List Items Addressed This Visit       Pulmonary emboli ([place])    Currently prescribed Xarelto.  Refilled today.      Essential hypertension - Primary    Currently prescribed amlodipine 5 mg daily.  BP 138/86 today.  Today he endorses bilateral ankle pain and swelling.  He has a chronic history of ankle swelling that precedes initiation of amlodipine, however he states that his swelling has never been this severe.  On exam he has nonpitting bilateral lower extremity edema radiating proximally to the mid shin. -Through shared decision making, Luis Gilbert would like to switch antihypertensive medications to ensure her amlodipine is not contributing to his lower extremity edema.  I have prescribed chlorthalidone 25 mg daily today.  We will plan for follow-up in 2 weeks for BP check.  Amlodipine has been discontinued.      Hyperlipidemia    Currently prescribed atorvastatin 20 mg daily.  Lipid panel updated November 2022.  No changes today.       History of asthma    He reports a history of asthma.  Currently prescribed Advair for twice daily use and albuterol as needed.  States that he has not needed to use his albuterol inhaler recently.  Unremarkable pulmonary exam today.      Chronic low back pain    He endorses a history of chronic low back pain today.  He was evaluated earlier this year by neurosurgery at [facility] and referred to PT and pain management.  He did not establish care with pain management or attend physical therapy due to cost and travel distance to travel physical.  He now has Medicaid and request new referral today. -Referrals placed to both pain management and physical therapy      Tobacco use    Currently smokes 0.5 packs/day of cigarettes.  He has been smoking for 20 years.  He knows that he needs to quit and declines NRT products today. -The patient was counseled on the dangers of tobacco use, and was advised to quit.  Reviewed strategies to maximize success, including removing cigarettes and smoking materials from environment, stress management, substitution of other forms of reinforcement, support of family/friends and written materials.      Elevated transaminase level    Mild AST/ALT elevation noted on recent CMP.  Previously attributed to alcohol use.  I have again counseled the patient on limiting alcohol consumption and limiting fatty/fried foods. -Repeat CMP today      Encounter to establish care    Presenting today to establish care.  Previous records reviewed. -Baseline labs ordered  today, including one-time HCV screening -He declined influenza and COVID-19 vaccines today -He will consider receiving the Shingrix vaccine at his follow-up appointment in 2 weeks -Cologuard ordered today for colorectal cancer screening      Return in about 2 weeks (around 12/12/2021).   Johnette Abraham, MD

## 2021-11-28 NOTE — Assessment & Plan Note (Signed)
Mild AST/ALT elevation noted on recent CMP.  Previously attributed to alcohol use.  I have again counseled the patient on limiting alcohol consumption and limiting fatty/fried foods. -Repeat CMP today

## 2021-11-28 NOTE — Assessment & Plan Note (Signed)
Currently prescribed atorvastatin 20 mg daily.  Lipid panel updated November 2022.  No changes today.

## 2021-11-28 NOTE — Assessment & Plan Note (Signed)
Currently prescribed Xarelto.  Refilled today.

## 2021-11-28 NOTE — Assessment & Plan Note (Signed)
He reports a history of asthma.  Currently prescribed Advair for twice daily use and albuterol as needed.  States that he has not needed to use his albuterol inhaler recently.  Unremarkable pulmonary exam today.

## 2021-11-29 ENCOUNTER — Telehealth: Payer: Self-pay

## 2021-11-29 MED ORDER — FUROSEMIDE 20 MG PO TABS: 20.0000 mg | ORAL_TABLET | Freq: Every day | ORAL | 3 refills | Status: DC | PRN

## 2021-11-29 NOTE — Telephone Encounter (Signed)
-----   Message from Laurine Blazer, LPN sent at [id number]/06/[id number]  1:26 PM EDT -----  ----- Message ----- From: Arnoldo Lenis, MD Sent: 11/28/2021  12:06 PM EDT To: Laurine Blazer, LPN  Echo shows normal heart function, no worrisome findings. Can he start lasix 20mg  just prn swelling please. F/u 6 months with [place]   J BrancH MD

## 2021-11-29 NOTE — Telephone Encounter (Signed)
Patient notified and verbalized understanding. Patient had no questions or concerns at this time. Patient agreeable with starting Lasix 20 mg tablets PRN swelling.

## 2021-12-12 ENCOUNTER — Encounter: Payer: Self-pay | Admitting: Internal Medicine

## 2021-12-12 ENCOUNTER — Ambulatory Visit: Payer: Medicaid Other | Admitting: Internal Medicine

## 2021-12-19 ENCOUNTER — Ambulatory Visit: Payer: Medicaid Other | Admitting: Internal Medicine

## 2021-12-21 ENCOUNTER — Ambulatory Visit: Payer: Medicaid Other | Admitting: Internal Medicine

## 2021-12-21 ENCOUNTER — Encounter: Payer: Self-pay | Admitting: Internal Medicine

## 2021-12-21 VITALS — BP 143/93 | HR 122 | Ht 69.0 in | Wt 216.8 lb

## 2021-12-21 DIAGNOSIS — M545 Low back pain, unspecified: Secondary | ICD-10-CM | POA: Diagnosis not present

## 2021-12-21 DIAGNOSIS — I10 Essential (primary) hypertension: Secondary | ICD-10-CM

## 2021-12-21 DIAGNOSIS — Z8709 Personal history of other diseases of the respiratory system: Secondary | ICD-10-CM

## 2021-12-21 DIAGNOSIS — G8929 Other chronic pain: Secondary | ICD-10-CM

## 2021-12-21 DIAGNOSIS — M542 Cervicalgia: Secondary | ICD-10-CM

## 2021-12-21 MED ORDER — NITROGLYCERIN 0.4 MG SL SUBL: SUBLINGUAL_TABLET | SUBLINGUAL | 1 refills | Status: DC

## 2021-12-21 MED ORDER — CYCLOBENZAPRINE HCL 5 MG PO TABS: 5.0000 mg | ORAL_TABLET | Freq: Every evening | ORAL | 0 refills | Status: DC | PRN

## 2021-12-21 NOTE — Progress Notes (Signed)
Established Patient Office Visit  Subjective   Patient ID: Luis Gilbert, male    DOB: 1965-11-17  Age: 56 y.o. MRN: [id number]  Chief Complaint  Patient presents with   Follow-up   Luis Gilbert returns to care today.  He is a 56 year old male with a past medical history significant for HTN, HLD, prior PE (currently on Xarelto), asthma, chronic low back pain, current tobacco use, alcohol use.  He was last seen by me on 10/2 to establish care.  At that time he endorsed bilateral ankle pain and swelling amlodipine was discontinued and chlorthalidone 25 mg daily was started.  2-week follow-up was arranged for BP check.  There have been no acute interval events.  Today Luis Gilbert states that he has a "catch in my neck".  He reports left-sided neck pain has been present for the last few days.  Pain is worse with lateral movements of the left.  He has no numbness or weakness in the left arm.  There is no inciting event or trauma at the onset of pain.  His blood pressure is mildly elevated again today.  He attributes this to feeling nervous.  He is accompanied by his wife today.  Acute concerns, chronic medical conditions, and outstanding preventative care items discussed today are individually addressed in A/P below.  Past Medical History:  Diagnosis Date   Hypertension    PE (pulmonary thromboembolism) ([place]) 04/2019   Past Surgical History:  Procedure Laterality Date   ARM SURGERY     Social History   Tobacco Use   Smoking status: Every Day    Packs/day: 0.25    Types: Cigarettes   Smokeless tobacco: Never  Vaping Use   Vaping Use: Never used  Substance Use Topics   Alcohol use: Yes    Comment: drinks 1- 40 oz daily   Drug use: Yes    Types: Marijuana    Comment: MJ qod.  no cocaine since 04/2019   Family History  Problem Relation Age of Onset   Cancer Mother    Cancer Father    Cancer Maternal Uncle    Hypertension Maternal Uncle    Cancer Paternal Uncle    Hypertension Paternal  Uncle    Allergies  Allergen Reactions   Tomato Swelling   Lisinopril     Other reaction(s): lip swelling   Review of Systems  Musculoskeletal:  Positive for neck pain (Left-sided neck pain).  All other systems reviewed and are negative.    Objective:     BP (!) 143/93   Pulse (!) 122   Ht 5' 9"  (1.753 m)   Wt 216 lb 12.8 oz (98.3 kg)   SpO2 96%   BMI 32.02 kg/m  BP Readings from Last 3 Encounters:  12/21/21 (!) 143/93  11/28/21 138/86  11/17/21 (!) 142/85   Physical Exam Vitals reviewed.  Constitutional:      General: He is not in acute distress.    Appearance: Normal appearance. He is obese. He is not ill-appearing.  HENT:     Head: Normocephalic and atraumatic.     Right Ear: External ear normal.     Left Ear: External ear normal.     Nose: Nose normal. No congestion or rhinorrhea.     Mouth/Throat:     Mouth: Mucous membranes are moist.     Pharynx: Oropharynx is clear.  Eyes:     Extraocular Movements: Extraocular movements intact.     Conjunctiva/sclera: Conjunctivae normal.  Pupils: Pupils are equal, round, and reactive to light.  Cardiovascular:     Rate and Rhythm: Normal rate and regular rhythm.     Pulses: Normal pulses.     Heart sounds: Normal heart sounds. No murmur heard. Pulmonary:     Effort: Pulmonary effort is normal.     Breath sounds: Normal breath sounds. No wheezing, rhonchi or rales.  Abdominal:     General: Abdomen is flat. Bowel sounds are normal. There is no distension.     Palpations: Abdomen is soft.     Tenderness: There is no abdominal tenderness.  Musculoskeletal:        General: Tenderness (Mild tenderness over the left trapezius) present. No swelling or deformity.     Cervical back: Normal range of motion.     Comments: Lateral movements of the neck to the left are limited secondary to pain.  ROM of the neck is otherwise intact.  Skin:    General: Skin is warm and dry.     Capillary Refill: Capillary refill takes less  than 2 seconds.  Neurological:     General: No focal deficit present.     Mental Status: He is alert and oriented to person, place, and time.     Motor: No weakness.  Psychiatric:        Mood and Affect: Mood normal.        Behavior: Behavior normal.        Thought Content: Thought content normal.    Last CBC Lab Results  Component Value Date   WBC 4.1 12/21/2021   HGB 17.2 12/21/2021   HCT 48.0 12/21/2021   MCV 95 12/21/2021   MCH 33.9 (H) 12/21/2021   RDW 13.1 12/21/2021   PLT 244 99/24/2683   Last metabolic panel Lab Results  Component Value Date   GLUCOSE 129 (H) 12/21/2021   NA 131 (L) 12/21/2021   K 3.8 12/21/2021   CL 84 (L) 12/21/2021   CO2 28 12/21/2021   BUN 16 12/21/2021   CREATININE 1.44 (H) 12/21/2021   EGFR 57 (L) 12/21/2021   CALCIUM 10.1 12/21/2021   PHOS 5.3 (H) 05/02/2019   PROT 9.5 (H) 12/21/2021   ALBUMIN 5.1 (H) 12/21/2021   LABGLOB 4.4 12/21/2021   AGRATIO 1.2 12/21/2021   BILITOT 0.9 12/21/2021   ALKPHOS 84 12/21/2021   AST 31 12/21/2021   ALT 31 12/21/2021   ANIONGAP 13 08/23/2021   Last lipids Lab Results  Component Value Date   CHOL 151 12/21/2021   HDL 50 12/21/2021   LDLCALC 64 12/21/2021   TRIG 233 (H) 12/21/2021   CHOLHDL 3.0 12/21/2021   Last hemoglobin A1c Lab Results  Component Value Date   HGBA1C 5.8 (H) 12/21/2021   Last vitamin D Lab Results  Component Value Date   VD25OH 12.8 (L) 12/21/2021   Last vitamin B12 and Folate Lab Results  Component Value Date   VITAMINB12 456 12/21/2021   FOLATE 9.6 12/21/2021     Assessment & Plan:   Problem List Items Addressed This Visit       Essential hypertension    BP 143/93 today.  Amlodipine was discontinued at his last appointment he was started on chlorthalidone 25 mg daily.  He has been taking the medication as prescribed but has not been checking his blood pressure at home.  He states that he feels nervous today. -No medication changes today.  Follow-up in 4  weeks for BP check.  In the interim, I have  asked him to check his blood pressure at home and bring a log to review at his next appointment.      History of asthma    Unclear pulmonary history.  Records have been requested from the free clinic of [facility].  He is currently prescribed Advair for twice daily use and albuterol for as needed use.  Asymptomatic currently.      Chronic low back pain    Chronic issue.  Unchanged in pattern or severity today.  He was previously referred to PT and pain management.  He states that he has scheduled an appointment with pain management for 11/9, but has not heard from pain management.  We will follow-up on this today.      Cervicalgia - Primary    He reports left-sided neck pain today.  His exam seems most consistent with a left trapezius strain.  I have prescribed Flexeril 5 mg for as needed use to help him sleep and have also provided him with PT exercises to complete at home.  Follow-up in 4 weeks for reassessment.       Return in about 4 weeks (around 01/18/2022).    Johnette Abraham, MD

## 2021-12-21 NOTE — Patient Instructions (Signed)
It was a pleasure to see you today.  Thank you for giving [place] the opportunity to be involved in your care.  Below is a brief recap of your visit and next steps.  We will plan to see you again in 4 weeks.  Summary I have prescribed flexeril for your neck pain. Please see the attached exercises as well. I will follow up on the pain management referral. We will look for records from the free clinic documenting your history of COPD  Next steps Follow up in 4 weeks

## 2021-12-22 ENCOUNTER — Other Ambulatory Visit: Payer: Self-pay | Admitting: Internal Medicine

## 2021-12-22 ENCOUNTER — Other Ambulatory Visit: Payer: Self-pay

## 2021-12-22 DIAGNOSIS — I10 Essential (primary) hypertension: Secondary | ICD-10-CM

## 2021-12-22 DIAGNOSIS — E559 Vitamin D deficiency, unspecified: Secondary | ICD-10-CM

## 2021-12-22 LAB — LIPID PANEL
Chol/HDL Ratio: 3 ratio (ref 0.0–5.0)
Cholesterol, Total: 151 mg/dL (ref 100–199)
HDL: 50 mg/dL (ref >39)
LDL Chol Calc (NIH): 64 mg/dL (ref 0–99)
Triglycerides: 233 mg/dL — ABNORMAL HIGH (ref 0–149)
VLDL Cholesterol Cal: 37 mg/dL (ref 5–40)

## 2021-12-22 LAB — CBC WITH DIFFERENTIAL/PLATELET
Basophils Absolute: 0 10*3/uL (ref 0.0–0.2)
Basos: 1 %
EOS (ABSOLUTE): 0 10*3/uL (ref 0.0–0.4)
Eos: 1 %
Hematocrit: 48 % (ref 37.5–51.0)
Hemoglobin: 17.2 g/dL (ref 13.0–17.7)
Immature Grans (Abs): 0 10*3/uL (ref 0.0–0.1)
Immature Granulocytes: 0 %
Lymphocytes Absolute: 1.1 10*3/uL (ref 0.7–3.1)
Lymphs: 28 %
MCH: 33.9 pg — ABNORMAL HIGH (ref 26.6–33.0)
MCHC: 35.8 g/dL — ABNORMAL HIGH (ref 31.5–35.7)
MCV: 95 fL (ref 79–97)
Monocytes Absolute: 0.5 10*3/uL (ref 0.1–0.9)
Monocytes: 12 %
Neutrophils Absolute: 2.4 10*3/uL (ref 1.4–7.0)
Neutrophils: 58 %
Platelets: 244 10*3/uL (ref 150–450)
RBC: 5.08 x10E6/uL (ref 4.14–5.80)
RDW: 13.1 % (ref 11.6–15.4)
WBC: 4.1 10*3/uL (ref 3.4–10.8)

## 2021-12-22 LAB — HEMOGLOBIN A1C
Est. average glucose Bld gHb Est-mCnc: 120 mg/dL
Hgb A1c MFr Bld: 5.8 % — ABNORMAL HIGH (ref 4.8–5.6)

## 2021-12-22 LAB — B12 AND FOLATE PANEL
Folate: 9.6 ng/mL (ref >3.0)
Vitamin B-12: 456 pg/mL (ref 232–1245)

## 2021-12-22 LAB — CMP14+EGFR
ALT: 31 IU/L (ref 0–44)
AST: 31 IU/L (ref 0–40)
Albumin/Globulin Ratio: 1.2 (ref 1.2–2.2)
Albumin: 5.1 g/dL — ABNORMAL HIGH (ref 3.8–4.9)
Alkaline Phosphatase: 84 IU/L (ref 44–121)
BUN/Creatinine Ratio: 11 (ref 9–20)
BUN: 16 mg/dL (ref 6–24)
Bilirubin Total: 0.9 mg/dL (ref 0.0–1.2)
CO2: 28 mmol/L (ref 20–29)
Calcium: 10.1 mg/dL (ref 8.7–10.2)
Chloride: 84 mmol/L — ABNORMAL LOW (ref 96–106)
Creatinine, Ser: 1.44 mg/dL — ABNORMAL HIGH (ref 0.76–1.27)
Globulin, Total: 4.4 g/dL (ref 1.5–4.5)
Glucose: 129 mg/dL — ABNORMAL HIGH (ref 70–99)
Potassium: 3.8 mmol/L (ref 3.5–5.2)
Sodium: 131 mmol/L — ABNORMAL LOW (ref 134–144)
Total Protein: 9.5 g/dL — ABNORMAL HIGH (ref 6.0–8.5)
eGFR: 57 mL/min/{1.73_m2} — ABNORMAL LOW (ref >59)

## 2021-12-22 LAB — HCV INTERPRETATION

## 2021-12-22 LAB — VITAMIN D 25 HYDROXY (VIT D DEFICIENCY, FRACTURES): Vit D, 25-Hydroxy: 12.8 ng/mL — ABNORMAL LOW (ref 30.0–100.0)

## 2021-12-22 LAB — HCV AB W REFLEX TO QUANT PCR: HCV Ab: NONREACTIVE

## 2021-12-22 MED ORDER — VITAMIN D (ERGOCALCIFEROL) 1.25 MG (50000 UNIT) PO CAPS: 50000.0000 [IU] | ORAL_CAPSULE | ORAL | 0 refills | Status: AC

## 2021-12-28 ENCOUNTER — Encounter: Payer: Self-pay | Admitting: Internal Medicine

## 2021-12-28 DIAGNOSIS — M542 Cervicalgia: Secondary | ICD-10-CM | POA: Insufficient documentation

## 2021-12-28 NOTE — Assessment & Plan Note (Signed)
BP 143/93 today.  Amlodipine was discontinued at his last appointment he was started on chlorthalidone 25 mg daily.  He has been taking the medication as prescribed but has not been checking his blood pressure at home.  He states that he feels nervous today. -No medication changes today.  Follow-up in 4 weeks for BP check.  In the interim, I have asked him to check his blood pressure at home and bring a log to review at his next appointment.

## 2021-12-28 NOTE — Assessment & Plan Note (Signed)
Chronic issue.  Unchanged in pattern or severity today.  He was previously referred to PT and pain management.  He states that he has scheduled an appointment with pain management for 11/9, but has not heard from pain management.  We will follow-up on this today.

## 2021-12-28 NOTE — Assessment & Plan Note (Signed)
Unclear pulmonary history.  Records have been requested from the free clinic of [facility].  He is currently prescribed Advair for twice daily use and albuterol for as needed use.  Asymptomatic currently.

## 2021-12-28 NOTE — Assessment & Plan Note (Signed)
He reports left-sided neck pain today.  His exam seems most consistent with a left trapezius strain.  I have prescribed Flexeril 5 mg for as needed use to help him sleep and have also provided him with PT exercises to complete at home.  Follow-up in 4 weeks for reassessment.

## 2022-01-03 NOTE — Therapy (Incomplete)
OUTPATIENT PHYSICAL THERAPY THORACOLUMBAR EVALUATION   Patient Name: Luis Gilbert MRN: [id number] DOB:[birth year], 56 y.o., male Today's Date: 01/03/2022    Past Medical History:  Diagnosis Date   Hypertension    PE (pulmonary thromboembolism) ([place]) 04/2019   Past Surgical History:  Procedure Laterality Date   ARM SURGERY     Patient Active Problem List   Diagnosis Date Noted   Cervicalgia 12/28/2021   Essential hypertension 11/28/2021   Hyperlipidemia 11/28/2021   History of asthma 11/28/2021   Chronic low back pain 11/28/2021   Tobacco use 11/28/2021   Elevated transaminase level 11/28/2021   Encounter to establish care 11/28/2021   Acute deep vein thrombosis (DVT) of left lower extremity ([place]) 05/01/2019   Hepatic steatosis 05/01/2019   Alcohol abuse with intoxication ([place]) 05/01/2019   Polysubstance abuse ([place]) 05/01/2019   Hypokalemia 05/01/2019   Pulmonary embolism ([place]) 76/73/4193   Alcoholic intoxication without complication ([place]) 79/03/4095   Elevated troponin 04/28/2019   Pulmonary emboli ([place]) 04/28/2019    PCP: ***  REFERRING PROVIDER: ***  REFERRING DIAG: ***  Rationale for Evaluation and Treatment: {HABREHAB:27488}  THERAPY DIAG:  No diagnosis found.  ONSET DATE: ***  SUBJECTIVE:                                                                                                                                                                                           SUBJECTIVE STATEMENT: ***  PERTINENT HISTORY:  ***  PAIN:  Are you having pain? {OPRCPAIN:27236}  PRECAUTIONS: {Therapy precautions:24002}  WEIGHT BEARING RESTRICTIONS: {Yes ***/No:24003}  FALLS:  Has patient fallen in last 6 months? {fallsyesno:27318}  LIVING ENVIRONMENT: Lives with: {OPRC lives with:25569::"lives with their family"} Lives in: {Lives in:25570} Stairs: {opstairs:27293} Has following equipment at home: {Assistive devices:23999}  OCCUPATION: ***  PLOF:  {PLOF:24004}  PATIENT GOALS: ***  NEXT MD VISIT:   OBJECTIVE:   DIAGNOSTIC FINDINGS:  ***  PATIENT SURVEYS:  {rehab surveys:24030}  SCREENING FOR RED FLAGS: Bowel or bladder incontinence: {Yes/No:304960894} Spinal tumors: {Yes/No:304960894} Cauda equina syndrome: {Yes/No:304960894} Compression fracture: {Yes/No:304960894} Abdominal aneurysm: {Yes/No:304960894}  COGNITION: Overall cognitive status: {cognition:24006}     SENSATION: {sensation:27233}  MUSCLE LENGTH: Hamstrings: Right *** deg; Left *** deg Thomas test: Right *** deg; Left *** deg  POSTURE: {posture:25561}  PALPATION: ***  LUMBAR ROM:   AROM eval  Flexion   Extension   Right lateral flexion   Left lateral flexion   Right rotation   Left rotation    (Blank rows = not tested)  LOWER EXTREMITY ROM:     {AROM/PROM:27142}  Right eval Left eval  Hip flexion    Hip extension  Hip abduction    Hip adduction    Hip internal rotation    Hip external rotation    Knee flexion    Knee extension    Ankle dorsiflexion    Ankle plantarflexion    Ankle inversion    Ankle eversion     (Blank rows = not tested)  LOWER EXTREMITY MMT:    MMT Right eval Left eval  Hip flexion    Hip extension    Hip abduction    Hip adduction    Hip internal rotation    Hip external rotation    Knee flexion    Knee extension    Ankle dorsiflexion    Ankle plantarflexion    Ankle inversion    Ankle eversion     (Blank rows = not tested)  LUMBAR SPECIAL TESTS:  {lumbar special test:25242}  FUNCTIONAL TESTS:  {Functional tests:24029}  GAIT: Distance walked: *** Assistive device utilized: {Assistive devices:23999} Level of assistance: {Levels of assistance:24026} Comments: ***  TODAY'S TREATMENT:                                                                                                                              DATE: ***    PATIENT EDUCATION:  Education details: *** Person educated:  {Person educated:25204} Education method: {Education Method:25205} Education comprehension: {Education Comprehension:25206}  HOME EXERCISE PROGRAM: ***  ASSESSMENT:  CLINICAL IMPRESSION: Patient is a *** y.o. *** who was seen today for physical therapy evaluation and treatment for ***.   OBJECTIVE IMPAIRMENTS: {opptimpairments:25111}.   ACTIVITY LIMITATIONS: {activitylimitations:27494}  PARTICIPATION LIMITATIONS: {participationrestrictions:25113}  PERSONAL FACTORS: {Personal factors:25162} are also affecting patient's functional outcome.   REHAB POTENTIAL: {rehabpotential:25112}  CLINICAL DECISION MAKING: {clinical decision making:25114}  EVALUATION COMPLEXITY: {Evaluation complexity:25115}   GOALS: Goals reviewed with patient? {yes/no:20286}  SHORT TERM GOALS: Target date: {follow up:25551}  *** Baseline: Goal status: {GOALSTATUS:25110}  2.  *** Baseline:  Goal status: {GOALSTATUS:25110}  3.  *** Baseline:  Goal status: {GOALSTATUS:25110}  4.  *** Baseline:  Goal status: {GOALSTATUS:25110}  5.  *** Baseline:  Goal status: {GOALSTATUS:25110}  6.  *** Baseline:  Goal status: {GOALSTATUS:25110}  LONG TERM GOALS: Target date: {follow up:25551}  *** Baseline:  Goal status: {GOALSTATUS:25110}  2.  *** Baseline:  Goal status: {GOALSTATUS:25110}  3.  *** Baseline:  Goal status: {GOALSTATUS:25110}  4.  *** Baseline:  Goal status: {GOALSTATUS:25110}  5.  *** Baseline:  Goal status: {GOALSTATUS:25110}  6.  *** Baseline:  Goal status: {GOALSTATUS:25110}  PLAN:  PT FREQUENCY: {rehab frequency:25116}  PT DURATION: {rehab duration:25117}  PLANNED INTERVENTIONS: {rehab planned interventions:25118::"Therapeutic exercises","Therapeutic activity","Neuromuscular re-education","Balance training","Gait training","Patient/Family education","Self Care","Joint mobilization"}.  PLAN FOR NEXT SESSION: ***   Ardis Rowan, PT 01/03/2022, 1:08 PM

## 2022-01-05 ENCOUNTER — Ambulatory Visit (HOSPITAL_COMMUNITY): Payer: Medicaid Other

## 2022-01-18 ENCOUNTER — Ambulatory Visit: Payer: Medicaid Other | Admitting: Internal Medicine

## 2022-01-27 NOTE — Therapy (Deleted)
d 

## 2022-01-30 ENCOUNTER — Ambulatory Visit: Payer: Medicaid Other | Admitting: Internal Medicine

## 2022-01-30 ENCOUNTER — Encounter: Payer: Self-pay | Admitting: Internal Medicine

## 2022-01-30 ENCOUNTER — Ambulatory Visit (HOSPITAL_COMMUNITY): Payer: Medicaid Other | Admitting: Physical Therapy

## 2022-02-28 ENCOUNTER — Other Ambulatory Visit: Payer: Self-pay

## 2022-02-28 ENCOUNTER — Telehealth: Payer: Self-pay | Admitting: Internal Medicine

## 2022-02-28 MED ORDER — RIVAROXABAN 10 MG PO TABS: 10.0000 mg | ORAL_TABLET | Freq: Every day | ORAL | 0 refills | Status: DC

## 2022-02-28 NOTE — Telephone Encounter (Signed)
Pt is still within 30 days from dismissal & needs med refill  Prescription Request  02/28/2022  Is this a "Controlled Substance" medicine? No  LOV: 12/21/2021  What is the name of the medication or equipment? rivaroxaban (XARELTO) 10 MG TABS tablet   Have you contacted your pharmacy to request a refill? No   Which pharmacy would you like this sent to?  [facility], [address], [place], [place] [address] Phone: [phone] Fax: [phone] - [place], [place] [address] Phone: [phone] Fax: [phone]    Patient notified that their request is being sent to the clinical staff for review and that they should receive a response within 2 business days.   Please advise at [facility] [phone]

## 2022-02-28 NOTE — Telephone Encounter (Signed)
Refilled

## 2022-03-04 ENCOUNTER — Other Ambulatory Visit: Payer: Self-pay | Admitting: Internal Medicine

## 2022-03-04 DIAGNOSIS — I10 Essential (primary) hypertension: Secondary | ICD-10-CM

## 2022-05-12 ENCOUNTER — Encounter: Payer: Self-pay | Admitting: Family Medicine

## 2022-05-12 ENCOUNTER — Ambulatory Visit (INDEPENDENT_AMBULATORY_CARE_PROVIDER_SITE_OTHER): Payer: Medicaid Other | Admitting: Family Medicine

## 2022-05-12 DIAGNOSIS — J45909 Unspecified asthma, uncomplicated: Secondary | ICD-10-CM | POA: Insufficient documentation

## 2022-05-12 DIAGNOSIS — Z720 Tobacco use: Secondary | ICD-10-CM

## 2022-05-12 DIAGNOSIS — I700 Atherosclerosis of aorta: Secondary | ICD-10-CM | POA: Insufficient documentation

## 2022-05-12 DIAGNOSIS — M542 Cervicalgia: Secondary | ICD-10-CM | POA: Diagnosis not present

## 2022-05-12 DIAGNOSIS — I2510 Atherosclerotic heart disease of native coronary artery without angina pectoris: Secondary | ICD-10-CM | POA: Diagnosis not present

## 2022-05-12 DIAGNOSIS — E782 Mixed hyperlipidemia: Secondary | ICD-10-CM

## 2022-05-12 DIAGNOSIS — I2699 Other pulmonary embolism without acute cor pulmonale: Secondary | ICD-10-CM

## 2022-05-12 DIAGNOSIS — E559 Vitamin D deficiency, unspecified: Secondary | ICD-10-CM

## 2022-05-12 DIAGNOSIS — F1010 Alcohol abuse, uncomplicated: Secondary | ICD-10-CM | POA: Insufficient documentation

## 2022-05-12 DIAGNOSIS — I10 Essential (primary) hypertension: Secondary | ICD-10-CM

## 2022-05-12 MED ORDER — ATORVASTATIN CALCIUM 20 MG PO TABS: 20.0000 mg | ORAL_TABLET | Freq: Every day | ORAL | 3 refills | Status: DC

## 2022-05-12 MED ORDER — FUROSEMIDE 20 MG PO TABS: 20.0000 mg | ORAL_TABLET | Freq: Every day | ORAL | 3 refills | Status: DC | PRN

## 2022-05-12 MED ORDER — RIVAROXABAN 10 MG PO TABS: 10.0000 mg | ORAL_TABLET | Freq: Every day | ORAL | 1 refills | Status: DC

## 2022-05-12 MED ORDER — CYCLOBENZAPRINE HCL 5 MG PO TABS: 5.0000 mg | ORAL_TABLET | Freq: Every evening | ORAL | 1 refills | Status: DC | PRN

## 2022-05-12 MED ORDER — NITROGLYCERIN 0.4 MG SL SUBL: SUBLINGUAL_TABLET | SUBLINGUAL | 1 refills | Status: AC

## 2022-05-12 MED ORDER — CHLORTHALIDONE 25 MG PO TABS: 25.0000 mg | ORAL_TABLET | Freq: Every day | ORAL | 3 refills | Status: DC

## 2022-05-12 MED ORDER — FLUTICASONE-SALMETEROL 100-50 MCG/ACT IN AEPB: 1.0000 | INHALATION_SPRAY | Freq: Two times a day (BID) | RESPIRATORY_TRACT | 5 refills | Status: DC

## 2022-05-12 NOTE — Patient Instructions (Signed)
Meds refilled.  Labs today.  Follow up in 6 months.

## 2022-05-12 NOTE — Assessment & Plan Note (Signed)
Uncontrolled.  Restarting medications.

## 2022-05-12 NOTE — Assessment & Plan Note (Signed)
Needs tobacco cessation.

## 2022-05-12 NOTE — Assessment & Plan Note (Signed)
Continue Wixela.  Refilled today.

## 2022-05-12 NOTE — Assessment & Plan Note (Signed)
Continue statin. 

## 2022-05-12 NOTE — Progress Notes (Signed)
Subjective:  Patient ID: Luis Gilbert, male    DOB: 1965-03-15  Age: 57 y.o. MRN: [id number]  CC: Chief Complaint  Patient presents with   Establish Care    HPI:  57 year old male with coronary artery disease, history of DVT and PE, aortic atherosclerosis, asthma, tobacco abuse, history of polysubstance abuse, history of alcohol abuse, chronic low back pain presents to establish care.  Patient's blood pressure is uncontrolled.  Patient has been out of his medication.  He is supposed to be taking chlorthalidone for hypertension.  He has also been prescribed Lasix by cardiology.  Needs refill.  Patient's lipids have been at goal on statin therapy.  Most recent lipid panel revealed an LDL of 64.  No reported issues with statin.  Needs refill on Lipitor 20 mg.  Patient has a history of asthma.  I can find no records of COPD but it is likely that he has a component of COPD given his tobacco abuse.  He is on [employer].  Needs refill.  Patient is on chronic anticoagulation with Xarelto.  He has only had 1 prior DVT/PE.  I am unsure whether he needs to continue lifelong anticoagulation.  Will reach out to hematology/oncology for their opinion.  Patient states that he drinks 2 alcoholic beverages a day.  He continues to smoke.  Patient Active Problem List   Diagnosis Date Noted   Asthma 05/12/2022   CAD (coronary artery disease) 05/12/2022   Aortic atherosclerosis ([place]) 05/12/2022   Alcohol abuse 05/12/2022   Essential hypertension 11/28/2021   Hyperlipidemia 11/28/2021   Chronic low back pain 11/28/2021   Tobacco use 11/28/2021   Hepatic steatosis 05/01/2019   Polysubstance abuse ([place]) 05/01/2019   Pulmonary embolism ([place]) 04/28/2019    Social Hx   Social History   Socioeconomic History   Marital status: Married    Spouse name: Not on file   Number of children: Not on file   Years of education: Not on file   Highest education level: Not on file  Occupational History   Not on  file  Tobacco Use   Smoking status: Every Day    Packs/day: 1.00    Years: 20.00    Additional pack years: 0.00    Total pack years: 20.00    Types: Cigarettes   Smokeless tobacco: Never  Vaping Use   Vaping Use: Never used  Substance and Sexual Activity   Alcohol use: Yes    Comment: drinks 1- 40 oz daily   Drug use: Yes    Types: Marijuana    Comment: MJ qod.  no cocaine since 04/2019   Sexual activity: Yes  Other Topics Concern   Not on file  Social History Narrative   Not on file   Social Determinants of Health   Financial Resource Strain: Not on file  Food Insecurity: Not on file  Transportation Needs: Not on file  Physical Activity: Not on file  Stress: Not on file  Social Connections: Not on file    Review of Systems  Constitutional: Negative.   Respiratory: Negative.    Cardiovascular: Negative.    Objective:  BP (!) 149/95   Pulse 95   Temp 97.6 F (36.4 C)   Ht 5\' 9"  (1.753 m)   Wt 215 lb (97.5 kg)   SpO2 100%   BMI 31.75 kg/m      05/12/2022    9:51 AM 12/21/2021    1:30 PM 11/28/2021    1:23 PM  BP/Weight  Systolic BP 123456 [id number]  Diastolic BP 95 93 86  Wt. (Lbs) 215 216.8 230  BMI 31.75 kg/m2 32.02 kg/m2 33.48 kg/m2    Physical Exam Vitals and nursing note reviewed.  Constitutional:      General: He is not in acute distress.    Appearance: Normal appearance.  HENT:     Head: Normocephalic and atraumatic.  Eyes:     General:        Right eye: No discharge.        Left eye: No discharge.     Conjunctiva/sclera: Conjunctivae normal.  Cardiovascular:     Rate and Rhythm: Normal rate and regular rhythm.  Pulmonary:     Effort: Pulmonary effort is normal.     Breath sounds: Normal breath sounds. No wheezing, rhonchi or rales.  Neurological:     Mental Status: He is alert.  Psychiatric:        Mood and Affect: Mood normal.        Behavior: Behavior normal.     Lab Results  Component Value Date   WBC 4.1 12/21/2021   HGB 17.2  12/21/2021   HCT 48.0 12/21/2021   PLT 244 12/21/2021   GLUCOSE 129 (H) 12/21/2021   CHOL 151 12/21/2021   TRIG 233 (H) 12/21/2021   HDL 50 12/21/2021   LDLCALC 64 12/21/2021   ALT 31 12/21/2021   AST 31 12/21/2021   NA 131 (L) 12/21/2021   K 3.8 12/21/2021   CL 84 (L) 12/21/2021   CREATININE 1.44 (H) 12/21/2021   BUN 16 12/21/2021   CO2 28 12/21/2021   INR 1.0 04/28/2019   HGBA1C 5.8 (H) 12/21/2021     Assessment & Plan:   Problem List Items Addressed This Visit       Cardiovascular and Mediastinum   Aortic atherosclerosis (HCC)   Relevant Medications   atorvastatin (LIPITOR) 20 MG tablet   chlorthalidone (HYGROTON) 25 MG tablet   furosemide (LASIX) 20 MG tablet   nitroGLYCERIN (NITROSTAT) 0.4 MG SL tablet   rivaroxaban (XARELTO) 10 MG TABS tablet   CAD (coronary artery disease)    Stable currently.  Continue statin.      Relevant Medications   atorvastatin (LIPITOR) 20 MG tablet   chlorthalidone (HYGROTON) 25 MG tablet   furosemide (LASIX) 20 MG tablet   nitroGLYCERIN (NITROSTAT) 0.4 MG SL tablet   rivaroxaban (XARELTO) 10 MG TABS tablet   Essential hypertension    Uncontrolled.  Restarting medications.      Relevant Medications   atorvastatin (LIPITOR) 20 MG tablet   chlorthalidone (HYGROTON) 25 MG tablet   furosemide (LASIX) 20 MG tablet   nitroGLYCERIN (NITROSTAT) 0.4 MG SL tablet   rivaroxaban (XARELTO) 10 MG TABS tablet   Other Relevant Orders   CMP14+EGFR   Pulmonary embolism (HCC)    Continuing Xarelto for now.  I have reached out to hematology/oncology.  Waiting for response back      Relevant Medications   atorvastatin (LIPITOR) 20 MG tablet   chlorthalidone (HYGROTON) 25 MG tablet   furosemide (LASIX) 20 MG tablet   nitroGLYCERIN (NITROSTAT) 0.4 MG SL tablet   rivaroxaban (XARELTO) 10 MG TABS tablet     Respiratory   Asthma    Continue Wixela.  Refilled today.      Relevant Medications   fluticasone-salmeterol (WIXELA INHUB) 100-50  MCG/ACT AEPB     Other   RESOLVED: Cervicalgia   Relevant Medications   cyclobenzaprine (FLEXERIL) 5 MG tablet  Hyperlipidemia    Continue statin      Relevant Medications   atorvastatin (LIPITOR) 20 MG tablet   chlorthalidone (HYGROTON) 25 MG tablet   furosemide (LASIX) 20 MG tablet   nitroGLYCERIN (NITROSTAT) 0.4 MG SL tablet   rivaroxaban (XARELTO) 10 MG TABS tablet   Tobacco use    Needs tobacco cessation.      Other Visit Diagnoses     Vitamin D deficiency       Relevant Orders   Vitamin D, 25-hydroxy       Meds ordered this encounter  Medications   atorvastatin (LIPITOR) 20 MG tablet    Sig: Take 1 tablet (20 mg total) by mouth daily.    Dispense:  90 tablet    Refill:  3   chlorthalidone (HYGROTON) 25 MG tablet    Sig: Take 1 tablet (25 mg total) by mouth daily.    Dispense:  90 tablet    Refill:  3   cyclobenzaprine (FLEXERIL) 5 MG tablet    Sig: Take 1 tablet (5 mg total) by mouth at bedtime as needed for muscle spasms.    Dispense:  90 tablet    Refill:  1   furosemide (LASIX) 20 MG tablet    Sig: Take 1 tablet (20 mg total) by mouth daily as needed (Swelling).    Dispense:  30 tablet    Refill:  3   nitroGLYCERIN (NITROSTAT) 0.4 MG SL tablet    Sig: as neede for chest pain; may repeat q 5 min x 3 prn; go to ED if still having chest pain    Dispense:  30 tablet    Refill:  1   rivaroxaban (XARELTO) 10 MG TABS tablet    Sig: Take 1 tablet (10 mg total) by mouth daily.    Dispense:  90 tablet    Refill:  1   fluticasone-salmeterol (WIXELA INHUB) 100-50 MCG/ACT AEPB    Sig: Inhale 1 puff into the lungs 2 (two) times daily.    Dispense:  60 each    Refill:  5    Follow-up:  Return in about 6 months (around 11/12/2022).  [place]

## 2022-05-12 NOTE — Assessment & Plan Note (Signed)
Stable currently.  Continue statin.

## 2022-05-12 NOTE — Assessment & Plan Note (Signed)
Continuing Xarelto for now.  I have reached out to hematology/oncology.  Waiting for response back

## 2022-05-13 LAB — CMP14+EGFR
ALT: 35 IU/L (ref 0–44)
AST: 27 IU/L (ref 0–40)
Albumin/Globulin Ratio: 1.2 (ref 1.2–2.2)
Albumin: 4.4 g/dL (ref 3.8–4.9)
Alkaline Phosphatase: 73 IU/L (ref 44–121)
BUN/Creatinine Ratio: 6 — ABNORMAL LOW (ref 9–20)
BUN: 7 mg/dL (ref 6–24)
Bilirubin Total: 0.4 mg/dL (ref 0.0–1.2)
CO2: 22 mmol/L (ref 20–29)
Calcium: 9.4 mg/dL (ref 8.7–10.2)
Chloride: 99 mmol/L (ref 96–106)
Creatinine, Ser: 1.15 mg/dL (ref 0.76–1.27)
Globulin, Total: 3.6 g/dL (ref 1.5–4.5)
Glucose: 90 mg/dL (ref 70–99)
Potassium: 4.2 mmol/L (ref 3.5–5.2)
Sodium: 137 mmol/L (ref 134–144)
Total Protein: 8 g/dL (ref 6.0–8.5)
eGFR: 75 mL/min/{1.73_m2} (ref >59)

## 2022-05-13 LAB — VITAMIN D 25 HYDROXY (VIT D DEFICIENCY, FRACTURES): Vit D, 25-Hydroxy: 42.1 ng/mL (ref 30.0–100.0)

## 2022-07-18 ENCOUNTER — Ambulatory Visit: Payer: Medicaid Other | Admitting: Family Medicine

## 2022-09-22 ENCOUNTER — Emergency Department (HOSPITAL_COMMUNITY)
Admission: EM | Admit: 2022-09-22 | Discharge: 2022-09-22 | Disposition: A | Discharge status: Home / Self Care | Payer: Medicaid Other | Attending: Emergency Medicine | Admitting: Emergency Medicine

## 2022-09-22 ENCOUNTER — Emergency Department (HOSPITAL_COMMUNITY): Payer: Medicaid Other

## 2022-09-22 ENCOUNTER — Other Ambulatory Visit: Payer: Self-pay

## 2022-09-22 DIAGNOSIS — Z79899 Other long term (current) drug therapy: Secondary | ICD-10-CM | POA: Insufficient documentation

## 2022-09-22 DIAGNOSIS — R079 Chest pain, unspecified: Secondary | ICD-10-CM | POA: Diagnosis not present

## 2022-09-22 DIAGNOSIS — R0789 Other chest pain: Secondary | ICD-10-CM

## 2022-09-22 DIAGNOSIS — Z7901 Long term (current) use of anticoagulants: Secondary | ICD-10-CM | POA: Insufficient documentation

## 2022-09-22 LAB — CBC WITH DIFFERENTIAL/PLATELET
Abs Immature Granulocytes: 0.01 10*3/uL (ref 0.00–0.07)
Basophils Absolute: 0 10*3/uL (ref 0.0–0.1)
Basophils Relative: 1 %
Eosinophils Absolute: 0.1 10*3/uL (ref 0.0–0.5)
Eosinophils Relative: 3 %
HCT: 39.8 % (ref 39.0–52.0)
Hemoglobin: 13.8 g/dL (ref 13.0–17.0)
Immature Granulocytes: 0 %
Lymphocytes Relative: 43 %
Lymphs Abs: 1.9 10*3/uL (ref 0.7–4.0)
MCH: 34.8 pg — ABNORMAL HIGH (ref 26.0–34.0)
MCHC: 34.7 g/dL (ref 30.0–36.0)
MCV: 100.5 fL — ABNORMAL HIGH (ref 80.0–100.0)
Monocytes Absolute: 0.4 10*3/uL (ref 0.1–1.0)
Monocytes Relative: 9 %
Neutro Abs: 1.9 10*3/uL (ref 1.7–7.7)
Neutrophils Relative %: 44 %
Platelets: 203 10*3/uL (ref 150–400)
RBC: 3.96 MIL/uL — ABNORMAL LOW (ref 4.22–5.81)
RDW: 12.9 % (ref 11.5–15.5)
WBC: 4.3 10*3/uL (ref 4.0–10.5)
nRBC: 0 % (ref 0.0–0.2)

## 2022-09-22 LAB — COMPREHENSIVE METABOLIC PANEL
ALT: 62 U/L — ABNORMAL HIGH (ref 0–44)
AST: 59 U/L — ABNORMAL HIGH (ref 15–41)
Albumin: 3.4 g/dL — ABNORMAL LOW (ref 3.5–5.0)
Alkaline Phosphatase: 45 U/L (ref 38–126)
Anion gap: 10 (ref 5–15)
BUN: 15 mg/dL (ref 6–20)
CO2: 23 mmol/L (ref 22–32)
Calcium: 8.8 mg/dL — ABNORMAL LOW (ref 8.9–10.3)
Chloride: 103 mmol/L (ref 98–111)
Creatinine, Ser: 0.99 mg/dL (ref 0.61–1.24)
GFR, Estimated: >60 mL/min (ref >60)
Glucose, Bld: 96 mg/dL (ref 70–99)
Potassium: 3.7 mmol/L (ref 3.5–5.1)
Sodium: 136 mmol/L (ref 135–145)
Total Bilirubin: 0.8 mg/dL (ref 0.3–1.2)
Total Protein: 7.6 g/dL (ref 6.5–8.1)

## 2022-09-22 LAB — TROPONIN I (HIGH SENSITIVITY): Troponin I (High Sensitivity): 7 ng/L (ref <18)

## 2022-09-22 MED ORDER — IOHEXOL 350 MG/ML SOLN
100.0000 mL | Freq: Once | INTRAVENOUS | Status: AC | PRN
  Administered 2022-09-22: 100 mL via INTRAVENOUS

## 2022-09-22 MED ORDER — SODIUM CHLORIDE 0.9 % IV BOLUS
1000.0000 mL | Freq: Once | INTRAVENOUS | Status: AC
  Administered 2022-09-22: 1000 mL via INTRAVENOUS

## 2022-09-22 NOTE — Discharge Instructions (Signed)
Take ibuprofen 600 mg every 6 hours as needed for pain.  Follow-up with primary doctor if not improving in the next week.

## 2022-09-22 NOTE — ED Notes (Signed)
Patient verbalizes understanding of discharge instructions. Opportunity for questioning and answers were provided. Armband removed by staff, pt discharged from ED. Ambulated out to lobby  

## 2022-09-22 NOTE — ED Triage Notes (Signed)
Patient from home for L rib pain that started 2 weeks ago. Reports he think he pulled a muscle. Pain does not travel. Upon arrival, patient is alert and oriented, ambu; in no apparent distress

## 2022-09-22 NOTE — ED Provider Notes (Signed)
[place] EMERGENCY DEPARTMENT AT [facility] Provider Note   CSN: [id number] Arrival date & time: 09/22/22  0018     History  Chief Complaint  Patient presents with   Chest Pain    Luis Gilbert is a 57 y.o. male.  Is a 57 year old male with history of pulmonary embolism off of anticoagulation for the past 6 months, hypertension, hyperlipidemia.  Patient presenting today with complaints of chest pain.  He describes a 2-week history of pain to the left lateral chest.  This is worse when he changes position, coughs, and takes a deep breath.  He feels occasionally short of breath, but denies any fevers, chills, or cough.  He denies any leg swelling or pain.  This feels somewhat similar to when he had a pulmonary embolism in the past.  The history is provided by the patient.       Home Medications Prior to Admission medications   Medication Sig Start Date End Date Taking? Authorizing Provider  Albuterol Sulfate, sensor, (PROAIR DIGIHALER) 108 (90 Base) MCG/ACT AEPB Inhale 2 puffs into the lungs every 6 (six) hours as needed. 08/24/21   Jacquelin Hawking, PA-C  atorvastatin (LIPITOR) 20 MG tablet Take 1 tablet (20 mg total) by mouth daily. 05/12/22 05/07/23  Tommie Sams, DO  chlorthalidone (HYGROTON) 25 MG tablet Take 1 tablet (25 mg total) by mouth daily. 05/12/22   Tommie Sams, DO  cyclobenzaprine (FLEXERIL) 5 MG tablet Take 1 tablet (5 mg total) by mouth at bedtime as needed for muscle spasms. 05/12/22   Cook, Verdis Frederickson, DO  fluticasone-salmeterol (WIXELA INHUB) 100-50 MCG/ACT AEPB Inhale 1 puff into the lungs 2 (two) times daily. 05/12/22   Tommie Sams, DO  furosemide (LASIX) 20 MG tablet Take 1 tablet (20 mg total) by mouth daily as needed (Swelling). 05/12/22 09/09/22  Tommie Sams, DO  nitroGLYCERIN (NITROSTAT) 0.4 MG SL tablet as neede for chest pain; may repeat q 5 min x 3 prn; go to ED if still having chest pain 05/12/22   Tommie Sams, DO  rivaroxaban (XARELTO) 10 MG  TABS tablet Take 1 tablet (10 mg total) by mouth daily. 05/12/22   Tommie Sams, DO      Allergies    Tomato and Lisinopril    Review of Systems   Review of Systems  All other systems reviewed and are negative.   Physical Exam Updated Vital Signs BP (!) 148/101   Pulse 93   Resp 18   Ht 5\' 9"  (1.753 m)   Wt 95.3 kg   SpO2 97%   BMI 31.01 kg/m  Physical Exam Vitals and nursing note reviewed.  Constitutional:      General: He is not in acute distress.    Appearance: He is well-developed. He is not diaphoretic.  HENT:     Head: Normocephalic and atraumatic.  Cardiovascular:     Rate and Rhythm: Normal rate and regular rhythm.     Heart sounds: No murmur heard.    No friction rub.  Pulmonary:     Effort: Pulmonary effort is normal. No respiratory distress.     Breath sounds: Normal breath sounds. No wheezing or rales.  Abdominal:     General: Bowel sounds are normal. There is no distension.     Palpations: Abdomen is soft.     Tenderness: There is no abdominal tenderness.  Musculoskeletal:        General: Normal range of motion.  Cervical back: Normal range of motion and neck supple.     Right lower leg: No tenderness. No edema.     Left lower leg: No tenderness. No edema.  Skin:    General: Skin is warm and dry.  Neurological:     Mental Status: He is alert and oriented to person, place, and time.     Coordination: Coordination normal.     ED Results / Procedures / Treatments   Labs (all labs ordered are listed, but only abnormal results are displayed) Labs Reviewed - No data to display  EKG EKG Interpretation Date/Time:  Friday September 22 2022 00:32:13 EDT Ventricular Rate:  93 PR Interval:  179 QRS Duration:  86 QT Interval:  336 QTC Calculation: 418 R Axis:   -10  Text Interpretation: Sinus rhythm Nonspecific T abnormalities, inferior leads Confirmed by Geoffery Lyons ([id number]) on 09/22/2022 12:42:39 AM  Radiology No results  found.  Procedures Procedures    Medications Ordered in ED Medications  sodium chloride 0.9 % bolus 1,000 mL (has no administration in time range)    ED Course/ Medical Decision Making/ A&P  Patient is a 57 year old male presenting with left-sided chest pain as described in the HPI.  He arrives here with stable vital signs and is afebrile.  There is no hypoxia.  Physical examination reveals reproducible tenderness to the left lateral chest wall.  Workup initiated including CBC, CMP, and troponin.  All studies basically unremarkable.  CTA of the chest obtained showing no evidence for pulmonary embolism or other acute process.  Highly doubt a cardiac etiology as his troponin is negative and EKG is unchanged and symptoms are extremely atypical.  Also doubt dissection and feel as though pneumothorax has been ruled out.  At this point patient to be discharged with rest, NSAIDs, and follow-up as needed.  Final Clinical Impression(s) / ED Diagnoses Final diagnoses:  None    Rx / DC Orders ED Discharge Orders     None         Geoffery Lyons, MD 09/22/22 [phone]

## 2022-09-25 ENCOUNTER — Telehealth: Payer: Self-pay

## 2022-09-25 NOTE — Transitions of Care (Post Inpatient/ED Visit) (Signed)
   09/25/2022  Name: Luis Gilbert MRN: [id number] DOB: [birth year]  Today's TOC FU Call Status: Today's TOC FU Call Status:: Successful TOC FU Call Competed TOC FU Call Complete Date: 09/25/22  Transition Care Management Follow-up Telephone Call Date of Discharge: 09/22/22 [facility] Type of Discharge: Emergency Department Reason for ED Visit: Other: (other chest pain) How have you been since you were released from the hospital?: Better Any questions or concerns?: No  Items Reviewed: Did you receive and understand the discharge instructions provided?: Yes Medications obtained,verified, and reconciled?: Yes (Medications Reviewed) Any new allergies since your discharge?: No Dietary orders reviewed?: Yes Do you have support at home?: Yes People in Home: spouse  Medications Reviewed Today: Medications Reviewed Today     Reviewed by Karena Addison, LPN (Licensed Practical Nurse) on 09/25/22 at 1446  Med List Status: <None>   Medication Order Taking? Sig Documenting Provider Last Dose Status Informant  Albuterol Sulfate, sensor, (PROAIR DIGIHALER) 108 (90 Base) MCG/ACT AEPB [id number] No Inhale 2 puffs into the lungs every 6 (six) hours as needed. Jacquelin Hawking, PA-C Taking Active   atorvastatin (LIPITOR) 20 MG tablet [id number]  Take 1 tablet (20 mg total) by mouth daily. Everlene Other G, DO  Active   chlorthalidone (HYGROTON) 25 MG tablet [id number]  Take 1 tablet (25 mg total) by mouth daily. Everlene Other G, DO  Active   cyclobenzaprine (FLEXERIL) 5 MG tablet [id number]  Take 1 tablet (5 mg total) by mouth at bedtime as needed for muscle spasms. Cook, Jayce G, DO  Active   fluticasone-salmeterol (WIXELA INHUB) 100-50 MCG/ACT AEPB [id number]  Inhale 1 puff into the lungs 2 (two) times daily. Everlene Other G, DO  Active   furosemide (LASIX) 20 MG tablet [id number]  Take 1 tablet (20 mg total) by mouth daily as needed (Swelling). Everlene Other G, DO  Expired 09/09/22 2359    nitroGLYCERIN (NITROSTAT) 0.4 MG SL tablet [id number]  as neede for chest pain; may repeat q 5 min x 3 prn; go to ED if still having chest pain Adriana Simas, Jayce G, DO  Active   rivaroxaban (XARELTO) 10 MG TABS tablet [id number]  Take 1 tablet (10 mg total) by mouth daily. Tommie Sams, DO  Active   Med List Note Haywood Lasso, CPhT 04/27/19 2226): No pharmacy preference            Home Care and Equipment/Supplies: Were Home Health Services Ordered?: NA Any new equipment or medical supplies ordered?: NA  Functional Questionnaire: Do you need assistance with bathing/showering or dressing?: No Do you need assistance with meal preparation?: No Do you need assistance with eating?: No Do you have difficulty maintaining continence: No Do you need assistance with getting out of bed/getting out of a chair/moving?: No Do you have difficulty managing or taking your medications?: No  Follow up appointments reviewed: PCP Follow-up appointment confirmed?: Yes Date of PCP follow-up appointment?: 10/05/22 Follow-up Provider: [facility] Follow-up appointment confirmed?: NA Do you need transportation to your follow-up appointment?: No Do you understand care options if your condition(s) worsen?: Yes-patient verbalized understanding    SIGNATURE Karena Addison, LPN [facility] Nurse Health Advisor Direct Dial [phone]

## 2022-10-05 ENCOUNTER — Inpatient Hospital Stay: Payer: Medicaid Other | Admitting: Family Medicine

## 2022-10-09 ENCOUNTER — Inpatient Hospital Stay: Payer: Medicaid Other | Admitting: Family Medicine

## 2022-11-13 ENCOUNTER — Ambulatory Visit (HOSPITAL_COMMUNITY)
Admission: RE | Admit: 2022-11-13 | Discharge: 2022-11-13 | Disposition: A | Discharge status: Home / Self Care | Payer: Medicaid Other | Source: Ambulatory Visit | Attending: Family Medicine | Admitting: Family Medicine

## 2022-11-13 ENCOUNTER — Ambulatory Visit (INDEPENDENT_AMBULATORY_CARE_PROVIDER_SITE_OTHER): Payer: Medicaid Other | Admitting: Family Medicine

## 2022-11-13 VITALS — BP 122/70 | HR 100 | Temp 97.3°F | Wt 209.0 lb

## 2022-11-13 DIAGNOSIS — R55 Syncope and collapse: Secondary | ICD-10-CM | POA: Insufficient documentation

## 2022-11-13 DIAGNOSIS — R054 Cough syncope: Secondary | ICD-10-CM | POA: Insufficient documentation

## 2022-11-13 DIAGNOSIS — K760 Fatty (change of) liver, not elsewhere classified: Secondary | ICD-10-CM | POA: Diagnosis not present

## 2022-11-13 DIAGNOSIS — R079 Chest pain, unspecified: Secondary | ICD-10-CM | POA: Insufficient documentation

## 2022-11-13 DIAGNOSIS — M545 Other chronic pain: Secondary | ICD-10-CM

## 2022-11-13 DIAGNOSIS — D7589 Other specified diseases of blood and blood-forming organs: Secondary | ICD-10-CM

## 2022-11-13 DIAGNOSIS — R053 Chronic cough: Secondary | ICD-10-CM

## 2022-11-13 DIAGNOSIS — R7303 Prediabetes: Secondary | ICD-10-CM

## 2022-11-13 DIAGNOSIS — G8929 Other chronic pain: Secondary | ICD-10-CM

## 2022-11-13 DIAGNOSIS — S0990XS Unspecified injury of head, sequela: Secondary | ICD-10-CM | POA: Diagnosis present

## 2022-11-13 DIAGNOSIS — E782 Mixed hyperlipidemia: Secondary | ICD-10-CM

## 2022-11-13 NOTE — Assessment & Plan Note (Signed)
Recent bout of chest pain.  EKG obtained today and revealed no significant findings other than left atrial enlargement.  Labs today.

## 2022-11-13 NOTE — Assessment & Plan Note (Signed)
Patient endorsing that this is posttussive.  However, given multiple syncopal episodes in the setting of his medical problems, this needs and warrants further workup.  Patient has had multiple head injuries as a result.  Needs urgent CT head for evaluation today.  Also needs laboratory studies, chest x-ray.

## 2022-11-13 NOTE — Assessment & Plan Note (Signed)
X-ray for further evaluation.

## 2022-11-13 NOTE — Progress Notes (Signed)
Subjective:  Patient ID: Luis Gilbert, male    DOB: [birth year]  Age: 57 y.o. MRN: [id number]  CC: Chief Complaint  Patient presents with   syncopy and collapse    Going on for a year an and off- falls and hits his head- per significant other appear to have seizures Exp cramps , numbness, chest pains takes nitroglycerin tabs    HPI:  57 year old with history of pulm embolism, hypertension, coronary artery disease, aortic atherosclerosis, tobacco abuse, chronic low back pain, hepatic steatosis presents for evaluation of the above.  Patient is a poor historian.  He is accompanied by his wife today.  Patient states that he has had several episodes where he has passed out.  Occur with paroxysmal self cough.  He has had ongoing cough.  He is a smoker.  Last episode was last night.  He had significant coughing and then subsequently passed out.  He is typically out for seconds to a few minutes.  There does not appear to be a postictal period.  No witnessed seizure-like activity.  His wife is very concerned.  Wife states that he has done this on multiple occasions recently and has had head injuries as a result.  She states that 1 time she found him down in a pool of blood.  Patient also reports last week he had a bout of chest pain.  He states that he took nitroglycerin.  Per the EMR, it does not look like he is compliant with his medication.  Patient Active Problem List   Diagnosis Date Noted   Syncope 11/13/2022   Chest pain 11/13/2022   Asthma 05/12/2022   CAD (coronary artery disease) 05/12/2022   Aortic atherosclerosis (HCC) 05/12/2022   Alcohol abuse 05/12/2022   Essential hypertension 11/28/2021   Hyperlipidemia 11/28/2021   Chronic low back pain 11/28/2021   Tobacco use 11/28/2021   Hepatic steatosis 05/01/2019   Polysubstance abuse (HCC) 05/01/2019   Pulmonary embolism (HCC) 04/28/2019    Social Hx   Social History   Socioeconomic History   Marital status: Married     Spouse name: Not on file   Number of children: Not on file   Years of education: Not on file   Highest education level: Not on file  Occupational History   Not on file  Tobacco Use   Smoking status: Every Day    Current packs/day: 1.00    Average packs/day: 1 pack/day for 20.0 years (20.0 ttl pk-yrs)    Types: Cigarettes   Smokeless tobacco: Never  Vaping Use   Vaping status: Never Used  Substance and Sexual Activity   Alcohol use: Yes    Comment: drinks 1- 40 oz daily   Drug use: Yes    Types: Marijuana    Comment: MJ qod.  no cocaine since 04/2019   Sexual activity: Yes  Other Topics Concern   Not on file  Social History Narrative   Not on file   Social Determinants of Health   Financial Resource Strain: Not on file  Food Insecurity: Not on file  Transportation Needs: Not on file  Physical Activity: Not on file  Stress: Not on file  Social Connections: Not on file    Review of Systems Per HPI  Objective:  BP 122/70   Pulse 100   Temp (!) 97.3 F (36.3 C)   Wt 209 lb (94.8 kg)   SpO2 95%   BMI 30.86 kg/m      11/13/2022    8:29  AM 09/22/2022    5:00 AM 09/22/2022    1:30 AM  BP/Weight  Systolic BP 122 151 119  Diastolic BP 70 117 75  Wt. (Lbs) 209    BMI 30.86 kg/m2      Physical Exam Vitals reviewed.  Constitutional:      General: He is not in acute distress.    Appearance: Normal appearance.  HENT:     Head: Normocephalic and atraumatic.     Mouth/Throat:     Pharynx: Oropharynx is clear.  Eyes:     General:        Right eye: No discharge.        Left eye: No discharge.     Conjunctiva/sclera: Conjunctivae normal.  Cardiovascular:     Rate and Rhythm: Normal rate and regular rhythm.  Pulmonary:     Effort: Pulmonary effort is normal.     Breath sounds: Normal breath sounds. No wheezing or rales.  Abdominal:     General: There is no distension.     Palpations: Abdomen is soft.     Comments: Mild tenderness in the epigastric region.   Neurological:     General: No focal deficit present.     Mental Status: He is alert.  Psychiatric:        Mood and Affect: Mood normal.        Behavior: Behavior normal.     Lab Results  Component Value Date   WBC 4.3 09/22/2022   HGB 13.8 09/22/2022   HCT 39.8 09/22/2022   PLT 203 09/22/2022   GLUCOSE 96 09/22/2022   CHOL 151 12/21/2021   TRIG 233 (H) 12/21/2021   HDL 50 12/21/2021   LDLCALC 64 12/21/2021   ALT 62 (H) 09/22/2022   AST 59 (H) 09/22/2022   NA 136 09/22/2022   K 3.7 09/22/2022   CL 103 09/22/2022   CREATININE 0.99 09/22/2022   BUN 15 09/22/2022   CO2 23 09/22/2022   INR 1.0 04/28/2019   HGBA1C 5.8 (H) 12/21/2021     Assessment & Plan:   Problem List Items Addressed This Visit       Digestive   Hepatic steatosis   Relevant Orders   CMP14+EGFR     Other   Hyperlipidemia   Relevant Orders   Lipid panel   Syncope - Primary    Patient endorsing that this is posttussive.  However, given multiple syncopal episodes in the setting of his medical problems, this needs and warrants further workup.  Patient has had multiple head injuries as a result.  Needs urgent CT head for evaluation today.  Also needs laboratory studies, chest x-ray.      Relevant Orders   CT HEAD WO CONTRAST ( )   D-dimer, quantitative   Chronic low back pain    X-ray for further evaluation.      Relevant Orders   DG Lumbar Spine Complete   Chest pain    Recent bout of chest pain.  EKG obtained today and revealed no significant findings other than left atrial enlargement.  Labs today.      Relevant Orders   EKG 12-Lead (Completed)   D-dimer, quantitative   Other Visit Diagnoses     Macrocytosis       Relevant Orders   CBC with Differential   Folate   Vitamin B12   Prediabetes       Relevant Orders   Hemoglobin A1c   Injury of head, sequela  Relevant Orders   CT HEAD WO CONTRAST ( )   Chronic cough       Relevant Orders   DG Chest 2 View        Follow-up:  Pending work up  [employer] DO [facility]

## 2022-11-13 NOTE — Patient Instructions (Signed)
Labs today.  Imaging at the hospital.  We will call with the results.  Follow up in 2 weeks. If this occurs again, you need to be evaluated in the ER.

## 2022-11-14 LAB — CBC WITH DIFFERENTIAL/PLATELET
Basophils Absolute: 0 10*3/uL (ref 0.0–0.2)
Basos: 1 %
EOS (ABSOLUTE): 0 10*3/uL (ref 0.0–0.4)
Eos: 1 %
Hematocrit: 43.8 % (ref 37.5–51.0)
Hemoglobin: 15.4 g/dL (ref 13.0–17.7)
Immature Grans (Abs): 0 10*3/uL (ref 0.0–0.1)
Immature Granulocytes: 0 %
Lymphocytes Absolute: 1.2 10*3/uL (ref 0.7–3.1)
Lymphs: 35 %
MCH: 34.5 pg — ABNORMAL HIGH (ref 26.6–33.0)
MCHC: 35.2 g/dL (ref 31.5–35.7)
MCV: 98 fL — ABNORMAL HIGH (ref 79–97)
Monocytes Absolute: 0.4 10*3/uL (ref 0.1–0.9)
Monocytes: 11 %
Neutrophils Absolute: 1.8 10*3/uL (ref 1.4–7.0)
Neutrophils: 52 %
Platelets: 187 10*3/uL (ref 150–450)
RBC: 4.47 x10E6/uL (ref 4.14–5.80)
RDW: 11.6 % (ref 11.6–15.4)
WBC: 3.4 10*3/uL (ref 3.4–10.8)

## 2022-11-14 LAB — CMP14+EGFR
ALT: 114 IU/L — ABNORMAL HIGH (ref 0–44)
AST: 94 IU/L — ABNORMAL HIGH (ref 0–40)
Albumin: 4.3 g/dL (ref 3.8–4.9)
Alkaline Phosphatase: 80 IU/L (ref 44–121)
BUN/Creatinine Ratio: 7 — ABNORMAL LOW (ref 9–20)
BUN: 9 mg/dL (ref 6–24)
Bilirubin Total: 0.4 mg/dL (ref 0.0–1.2)
CO2: 26 mmol/L (ref 20–29)
Calcium: 9.5 mg/dL (ref 8.7–10.2)
Chloride: 84 mmol/L — ABNORMAL LOW (ref 96–106)
Creatinine, Ser: 1.29 mg/dL — ABNORMAL HIGH (ref 0.76–1.27)
Globulin, Total: 3.8 g/dL (ref 1.5–4.5)
Glucose: 87 mg/dL (ref 70–99)
Potassium: 3.7 mmol/L (ref 3.5–5.2)
Sodium: 130 mmol/L — ABNORMAL LOW (ref 134–144)
Total Protein: 8.1 g/dL (ref 6.0–8.5)
eGFR: 65 mL/min/{1.73_m2} (ref >59)

## 2022-11-14 LAB — LIPID PANEL
Chol/HDL Ratio: 2.4 ratio (ref 0.0–5.0)
Cholesterol, Total: 150 mg/dL (ref 100–199)
HDL: 63 mg/dL (ref >39)
LDL Chol Calc (NIH): 72 mg/dL (ref 0–99)
Triglycerides: 79 mg/dL (ref 0–149)
VLDL Cholesterol Cal: 15 mg/dL (ref 5–40)

## 2022-11-14 LAB — FOLATE: Folate: 8 ng/mL (ref >3.0)

## 2022-11-14 LAB — VITAMIN B12: Vitamin B-12: 607 pg/mL (ref 232–1245)

## 2022-11-14 LAB — HEMOGLOBIN A1C
Est. average glucose Bld gHb Est-mCnc: 120 mg/dL
Hgb A1c MFr Bld: 5.8 % — ABNORMAL HIGH (ref 4.8–5.6)

## 2022-11-14 LAB — D-DIMER, QUANTITATIVE: D-DIMER: 0.41 mg{FEU}/L (ref 0.00–0.49)

## 2022-11-16 ENCOUNTER — Telehealth: Payer: Self-pay

## 2022-11-16 NOTE — Telephone Encounter (Signed)
Pt called back to get lab results, advised per Dr Adriana Simas, pt verbalized understanding.

## 2022-11-29 ENCOUNTER — Ambulatory Visit: Payer: Medicaid Other | Admitting: Family Medicine

## 2022-12-05 ENCOUNTER — Encounter: Payer: Self-pay | Admitting: Family Medicine

## 2022-12-05 ENCOUNTER — Ambulatory Visit (INDEPENDENT_AMBULATORY_CARE_PROVIDER_SITE_OTHER): Payer: Medicaid Other | Admitting: Family Medicine

## 2022-12-05 VITALS — BP 136/84 | HR 99 | Temp 97.7°F | Ht 69.0 in | Wt 203.0 lb

## 2022-12-05 DIAGNOSIS — I10 Essential (primary) hypertension: Secondary | ICD-10-CM

## 2022-12-05 DIAGNOSIS — R55 Syncope and collapse: Secondary | ICD-10-CM | POA: Diagnosis not present

## 2022-12-05 DIAGNOSIS — F1010 Alcohol abuse, uncomplicated: Secondary | ICD-10-CM | POA: Diagnosis not present

## 2022-12-05 DIAGNOSIS — R053 Chronic cough: Secondary | ICD-10-CM | POA: Diagnosis not present

## 2022-12-05 MED ORDER — BENZONATATE 200 MG PO CAPS: 200.0000 mg | ORAL_CAPSULE | Freq: Three times a day (TID) | ORAL | 3 refills | Status: DC | PRN

## 2022-12-05 MED ORDER — AMLODIPINE BESYLATE 5 MG PO TABS: 5.0000 mg | ORAL_TABLET | Freq: Every day | ORAL | 3 refills | Status: DC

## 2022-12-05 NOTE — Assessment & Plan Note (Signed)
Discussed with pulmonologist, Dr. Sherene Sires.  He sees posttussive syncope commonly. Placing referral to pulmonology.  Tessalon Perles as prescribed.

## 2022-12-05 NOTE — Progress Notes (Signed)
Subjective:  Patient ID: Luis Gilbert, male    DOB: 01/07/1966  Age: 57 y.o. MRN: [id number]  CC: Chief Complaint  Patient presents with   Loss of Consciousness    HPI:  57 year old male presents for follow-up regarding recurrent syncope.  Patient's previous labs revealed mildly elevated creatinine with hyponatremia and hypochloremia.  Elevated LFTs.  Patient drinks quite heavily.  Also on diuretic for hypertension.  Patient had negative CT head.  Patient has had 2 syncopal events since his last visit.  He states that these are all posttussive.  He is a smoker and continues to smoke.  Has previously seen cardiology who felt that this was not cardiac in origin and no further testing was done.  Wife states that he is recently had some bright red blood per rectum.  Patient states that he has a history of hemorrhoids.  Will discuss referral to GI.   Patient Active Problem List   Diagnosis Date Noted   Syncope 11/13/2022   Asthma 05/12/2022   CAD (coronary artery disease) 05/12/2022   Aortic atherosclerosis (HCC) 05/12/2022   Alcohol abuse 05/12/2022   Essential hypertension 11/28/2021   Hyperlipidemia 11/28/2021   Chronic low back pain 11/28/2021   Tobacco use 11/28/2021   Hepatic steatosis 05/01/2019   Polysubstance abuse (HCC) 05/01/2019   Pulmonary embolism (HCC) 04/28/2019    Social Hx   Social History   Socioeconomic History   Marital status: Married    Spouse name: Not on file   Number of children: Not on file   Years of education: Not on file   Highest education level: Not on file  Occupational History   Not on file  Tobacco Use   Smoking status: Every Day    Current packs/day: 1.00    Average packs/day: 1 pack/day for 20.0 years (20.0 ttl pk-yrs)    Types: Cigarettes   Smokeless tobacco: Never  Vaping Use   Vaping status: Never Used  Substance and Sexual Activity   Alcohol use: Yes    Comment: drinks 1- 40 oz daily   Drug use: Yes    Types: Marijuana     Comment: MJ qod.  no cocaine since 04/2019   Sexual activity: Yes  Other Topics Concern   Not on file  Social History Narrative   Not on file   Social Determinants of Health   Financial Resource Strain: Not on file  Food Insecurity: Not on file  Transportation Needs: Not on file  Physical Activity: Not on file  Stress: Not on file  Social Connections: Not on file    Review of Systems Per HPI  Objective:  BP 136/84   Pulse 99   Temp 97.7 F (36.5 C)   Ht 5\' 9"  (1.753 m)   Wt 203 lb (92.1 kg)   SpO2 99%   BMI 29.98 kg/m      12/05/2022    2:34 PM 11/13/2022    8:29 AM 09/22/2022    5:00 AM  BP/Weight  Systolic BP 136 122 151  Diastolic BP 84 70 117  Wt. (Lbs) 203 209   BMI 29.98 kg/m2 30.86 kg/m2     Physical Exam Vitals and nursing note reviewed.  Constitutional:      General: He is not in acute distress.    Appearance: Normal appearance.  HENT:     Head: Normocephalic and atraumatic.  Eyes:     General:        Right eye: No discharge.  Left eye: No discharge.     Conjunctiva/sclera: Conjunctivae normal.  Cardiovascular:     Rate and Rhythm: Normal rate and regular rhythm.  Pulmonary:     Effort: Pulmonary effort is normal.     Breath sounds: Normal breath sounds. No wheezing, rhonchi or rales.  Neurological:     Mental Status: He is alert.  Psychiatric:        Mood and Affect: Mood normal.        Behavior: Behavior normal.     Lab Results  Component Value Date   WBC 3.4 11/13/2022   HGB 15.4 11/13/2022   HCT 43.8 11/13/2022   PLT 187 11/13/2022   GLUCOSE 87 11/13/2022   CHOL 150 11/13/2022   TRIG 79 11/13/2022   HDL 63 11/13/2022   LDLCALC 72 11/13/2022   ALT 114 (H) 11/13/2022   AST 94 (H) 11/13/2022   NA 130 (L) 11/13/2022   K 3.7 11/13/2022   CL 84 (L) 11/13/2022   CREATININE 1.29 (H) 11/13/2022   BUN 9 11/13/2022   CO2 26 11/13/2022   INR 1.0 04/28/2019   HGBA1C 5.8 (H) 11/13/2022     Assessment & Plan:   Problem  List Items Addressed This Visit       Cardiovascular and Mediastinum   Essential hypertension    Blood pressure is stable.  Given hyponatremia, I am stopping his chlorthalidone and starting amlodipine.      Relevant Medications   amLODipine (NORVASC) 5 MG tablet     Other   Syncope - Primary    Discussed with pulmonologist, Dr. Sherene Sires.  He sees posttussive syncope commonly. Placing referral to pulmonology.  Tessalon Perles as prescribed.      Relevant Orders   Ambulatory referral to Pulmonology   Alcohol abuse    Patient currently drinks 2 to 3, 40 ounce beers daily.      Other Visit Diagnoses     Chronic cough       Relevant Orders   Ambulatory referral to Pulmonology       Meds ordered this encounter  Medications   benzonatate (TESSALON) 200 MG capsule    Sig: Take 1 capsule (200 mg total) by mouth 3 (three) times daily as needed for cough.    Dispense:  30 capsule    Refill:  3   amLODipine (NORVASC) 5 MG tablet    Sig: Take 1 tablet (5 mg total) by mouth daily.    Dispense:  90 tablet    Refill:  3    Follow-up:  3 months  Alezandra Egli Adriana Simas DO [facility]

## 2022-12-05 NOTE — Patient Instructions (Addendum)
Referral placed.  Cough medication sent.  Stop chlorthalidone.  Start amlodipine.  Follow up in 3 months or sooner if needed.

## 2022-12-05 NOTE — Assessment & Plan Note (Signed)
Blood pressure is stable.  Given hyponatremia, I am stopping his chlorthalidone and starting amlodipine.

## 2022-12-05 NOTE — Assessment & Plan Note (Signed)
Patient currently drinks 2 to 3, 40 ounce beers daily.

## 2023-01-07 NOTE — Progress Notes (Unsigned)
Luis Gilbert, male    DOB: September 08, 1965    MRN: [id number]   Brief patient profile:  52  yobm  active smoker  referred to pulmonary clinic in [place]  01/08/2023 by Dr Everlene Other for cough  with Neg CTa chest 09/22/2022 (done for L MSCP) with severe coughing to point of L MSCP and syncope off lisinopril x 2022   PE  / L DVT  04/2019  with demand ischemia/ mild decreased RV fx   History of Present Illness  01/08/2023  Pulmonary/ 1st office eval/ Sergey Ishler / [facility] on dpi albuterol  Chief Complaint  Patient presents with   Establish Care  Dyspnea:  walks 100 ft uphill to [employer] [place] and does not have to stop  Cough: resolved at present / no obvious pattern/ paroxysmal  min productive mostly daytime Sleep: flat bed / 3 pillows s resp cc  SABA use: last used 01/05/23  02: none Lung cancer screen: CTa 09/22/22 neg   No obvious day to day or daytime pattern/variability or assoc purulent sputum or mucus plugs or hemoptysis or cp or chest tightness, subjective wheeze or overt sinus or hb symptoms.    Also denies any obvious fluctuation of symptoms with weather or environmental changes or other aggravating or alleviating factors except as outlined above   No unusual exposure hx or h/o childhood pna/ asthma or knowledge of premature birth.  Current Allergies, Complete Past Medical History, Past Surgical History, Family History, and Social History were reviewed in [employer] record.  ROS  The following are not active complaints unless bolded Hoarseness, sore throat, dysphagia, dental problems, itching, sneezing,  nasal congestion or discharge of excess mucus or purulent secretions, ear ache,   fever, chills, sweats, unintended wt loss or wt gain, classically pleuritic or exertional cp,  orthopnea pnd or arm/hand swelling  or leg swelling, presyncope and syncope just during coughing fits , palpitations, abdominal pain, anorexia, nausea, vomiting, diarrhea   or change in bowel habits or change in bladder habits, change in stools or change in urine, dysuria, hematuria,  rash, arthralgias, visual complaints, headache, numbness, weakness or ataxia or problems with walking or coordination,  change in mood or  memory.            Outpatient Medications Prior to Visit  Medication Sig Dispense Refill   Albuterol Sulfate, sensor, (PROAIR DIGIHALER) 108 (90 Base) MCG/ACT AEPB Inhale 2 puffs into the lungs every 6 (six) hours as needed. 3 each 0          amLODipine (NORVASC) 10 MG tablet Take 10 mg by mouth daily. None on day of ov    benzonatate (TESSALON) 200 MG capsule Take 1 capsule (200 mg total) by mouth 3 (three) times daily as needed for cough. 30 capsule 3                 nitroGLYCERIN (NITROSTAT) 0.4 MG SL tablet as neede for chest pain; may repeat q 5 min x 3 prn; go to ED if still having chest pain 30 tablet 1   rivaroxaban (XARELTO) 10 MG TABS tablet Take 1 tablet (10 mg total) by mouth daily. 90 tablet 1             Past Medical History:  Diagnosis Date   COPD (chronic obstructive pulmonary disease) (HCC)    Hyperlipidemia    Hypertension    PE (pulmonary thromboembolism) (HCC) 04/2019      Objective:  BP (!) 165/105   Pulse 93   Ht 5\' 9"  (1.753 m)   Wt 210 lb (95.3 kg)   SpO2 96%   BMI 31.01 kg/m   SpO2: 96 %  RA / Note bp p he took am meds  Pleasant amb mod obese bm nad    HEENT : Oropharynx  clear      Nasal turbinates nl    NECK :  without  apparent JVD/ palpable Nodes/TM    LUNGS: no acc muscle use,  Nl contour chest with scattered insp/exp rhonchi bilaterally without cough on insp or exp maneuvers   CV:  RRR  no s3 or murmur or increase in P2, and no edema   ABD:  obese soft and nontender with nl inspiratory excursion in the supine position. No bruits or organomegaly appreciated   MS:  Nl gait/ ext warm without deformities Or obvious joint restrictions  calf tenderness, cyanosis or clubbing    SKIN:  warm and dry without lesions    NEURO:  alert, approp, nl sensorium with  no motor or cerebellar deficits apparent.      I personally reviewed images and agree with radiology impression as follows:   Chest CTa   Neg 09/22/2022       Assessment   Cough syncope likely related to UACS Onset 2022 ? While on ACEi - improved at time of pulmonary eval 01/08/2023 > try off pdi powder/ mucinex dm and gerd rx until no coughing   UACS = Upper airway cough syndrome (previously labeled PNDS),  is so named because it's frequently impossible to sort out how much is  CR/sinusitis with freq throat clearing (which can be related to primary GERD)   vs  causing  secondary (" extra esophageal")  GERD from wide swings in gastric pressure that occur with throat clearing, often  promoting self use of mint and menthol lozenges that reduce the lower esophageal sphincter tone and exacerbate the problem further in a cyclical fashion.   These are the same pts (now being labeled as having "irritable larynx syndrome" by some cough centers) who not infrequently have a history of having failed to tolerate ace inhibitors,  dry powder inhalers or biphosphonates or report having atypical/extraesophageal reflux symptoms that don't respond to standard doses of PPI  and are easily confused as having aecopd or asthma flares by even experienced allergists/ pulmonologists (myself included).   See above intervention/ warned to lie down immediately at onset of presyncope or severe coughing fits   F/u with PCP/Cards re optimal bp control s contributing to tendency to synope    Asthmatic bronchitis , chronic (HCC) Active smoker with cough syncope on DPI - 01/08/2023  After extensive coaching inhaler device,  effectiveness =    75% > try symbicort 80 up to every 12 hours    Essential hypertension Poorly controlled, non adherent > f/u with PCP asap with all meds in hand  Lab Results  Component Value Date   CREATININE 1.29 (H)  11/13/2022   CREATININE 0.99 09/22/2022   CREATININE 1.15 05/12/2022     Cigarette smoker Counseled re importance of smoking cessation but did not meet time criteria for separate billing    Each maintenance medication was reviewed in detail including emphasizing most importantly the difference between maintenance and prns and under what circumstances the prns are to be triggered using an action plan format where appropriate.  Total time for H and P, chart review, counseling, reviewing hfa device(s) and generating  customized AVS unique to this office visit / same day charting = 60 min new pt eval        Sandrea Hughs, MD 01/08/2023

## 2023-01-08 ENCOUNTER — Encounter: Payer: Self-pay | Admitting: Internal Medicine

## 2023-01-08 ENCOUNTER — Ambulatory Visit (INDEPENDENT_AMBULATORY_CARE_PROVIDER_SITE_OTHER): Payer: Medicaid Other | Admitting: Internal Medicine

## 2023-01-08 VITALS — BP 165/105 | HR 93 | Ht 69.0 in | Wt 210.0 lb

## 2023-01-08 DIAGNOSIS — I252 Old myocardial infarction: Secondary | ICD-10-CM | POA: Insufficient documentation

## 2023-01-08 DIAGNOSIS — M545 Low back pain, unspecified: Secondary | ICD-10-CM | POA: Insufficient documentation

## 2023-01-08 DIAGNOSIS — I2510 Atherosclerotic heart disease of native coronary artery without angina pectoris: Secondary | ICD-10-CM | POA: Insufficient documentation

## 2023-01-08 DIAGNOSIS — F1010 Alcohol abuse, uncomplicated: Secondary | ICD-10-CM | POA: Insufficient documentation

## 2023-01-08 DIAGNOSIS — T783XXA Angioneurotic edema, initial encounter: Secondary | ICD-10-CM | POA: Insufficient documentation

## 2023-01-08 DIAGNOSIS — G5710 Meralgia paresthetica, unspecified lower limb: Secondary | ICD-10-CM | POA: Insufficient documentation

## 2023-01-08 DIAGNOSIS — R55 Syncope and collapse: Secondary | ICD-10-CM | POA: Diagnosis not present

## 2023-01-08 DIAGNOSIS — I10 Essential (primary) hypertension: Secondary | ICD-10-CM | POA: Diagnosis not present

## 2023-01-08 DIAGNOSIS — F17210 Nicotine dependence, cigarettes, uncomplicated: Secondary | ICD-10-CM | POA: Diagnosis not present

## 2023-01-08 DIAGNOSIS — J4489 Other specified chronic obstructive pulmonary disease: Secondary | ICD-10-CM | POA: Diagnosis not present

## 2023-01-08 DIAGNOSIS — Z720 Tobacco use: Secondary | ICD-10-CM | POA: Insufficient documentation

## 2023-01-08 DIAGNOSIS — R054 Cough syncope: Secondary | ICD-10-CM

## 2023-01-08 DIAGNOSIS — G40909 Epilepsy, unspecified, not intractable, without status epilepticus: Secondary | ICD-10-CM | POA: Insufficient documentation

## 2023-01-08 DIAGNOSIS — I2089 Other forms of angina pectoris: Secondary | ICD-10-CM | POA: Insufficient documentation

## 2023-01-08 MED ORDER — BUDESONIDE-FORMOTEROL FUMARATE 80-4.5 MCG/ACT IN AERO: INHALATION_SPRAY | RESPIRATORY_TRACT | 12 refills | Status: AC

## 2023-01-08 NOTE — Patient Instructions (Addendum)
Symbicort 80 is up to 2 puffs every 12 hours until better for a full week then ok to taper off.  Best cough medication > mucinex DM 1200 mg every 12 hours as needed   Try prilosec otc 20mg   Take 30-60 min before first meal of the day and Pepcid ac (famotidine) 20 mg one after supper  until cough is completely gone for at least a week without the need for cough suppression     Follow up your blood pressure asap with your PCP  The key is to stop smoking completely before smoking completely stops you!    Please schedule a follow up office visit in 6 weeks, call sooner if needed with all medications /inhalers/ solutions in hand so we can verify exactly what you are taking. This includes all medications from all doctors and over the counters

## 2023-01-08 NOTE — Assessment & Plan Note (Addendum)
Onset 2022 ? While on ACEi - improved at time of pulmonary eval 01/08/2023 > try off pdi powder/ mucinex dm and gerd rx until no coughing   UACS = Upper airway cough syndrome (previously labeled PNDS),  is so named because it's frequently impossible to sort out how much is  CR/sinusitis with freq throat clearing (which can be related to primary GERD)   vs  causing  secondary (" extra esophageal")  GERD from wide swings in gastric pressure that occur with throat clearing, often  promoting self use of mint and menthol lozenges that reduce the lower esophageal sphincter tone and exacerbate the problem further in a cyclical fashion.   These are the same pts (now being labeled as having "irritable larynx syndrome" by some cough centers) who not infrequently have a history of having failed to tolerate ace inhibitors,  dry powder inhalers or biphosphonates or report having atypical/extraesophageal reflux symptoms that don't respond to standard doses of PPI  and are easily confused as having aecopd or asthma flares by even experienced allergists/ pulmonologists (myself included).   See above intervention/ warned to lie down immediately at onset of presyncope or severe coughing fits   F/u with PCP/Cards re optimal bp control s contributing to tendency to synope

## 2023-01-08 NOTE — Assessment & Plan Note (Signed)
Poorly controlled, non adherent > f/u with PCP asap with all meds in hand   Lab Results  Component Value Date   CREATININE 1.29 (H) 11/13/2022   CREATININE 0.99 09/22/2022   CREATININE 1.15 05/12/2022

## 2023-01-08 NOTE — Assessment & Plan Note (Signed)
Counseled re importance of smoking cessation but did not meet time criteria for separate billing           Each maintenance medication was reviewed in detail including emphasizing most importantly the difference between maintenance and prns and under what circumstances the prns are to be triggered using an action plan format where appropriate.  Total time for H and P, chart review, counseling, reviewing hfa device(s) and generating customized AVS unique to this office visit / same day charting = 60 min new pt eval

## 2023-01-09 NOTE — Assessment & Plan Note (Signed)
Active smoker with cough syncope on DPI - 01/08/2023  After extensive coaching inhaler device,  effectiveness =    75% > try symbicort 80 up to every 12 hours

## 2023-01-31 IMAGING — CT CT ABD-PELV W/ CM
2 of 5 series · 16 of 46 positions shown, 18 images · IV contrast (omnipaque)
Comparison: None.

CLINICAL DATA: Abdominal pain, acute, nonlocalized RUQ abdominal
pain

EXAM:
CT ABDOMEN AND PELVIS WITH CONTRAST
TECHNIQUE: Multidetector CT imaging of the abdomen and pelvis was performed
using the standard protocol following bolus administration of
intravenous contrast.
CONTRAST:  100mL OMNIPAQUE IOHEXOL 300 MG/ML  SOLN

[Series 2: axial st · axial · 0.92mm/px · z∈[+1062,+1477]mm · 13 of 93 slices shown, 15 images]
[im 5/93  soft-tissue]
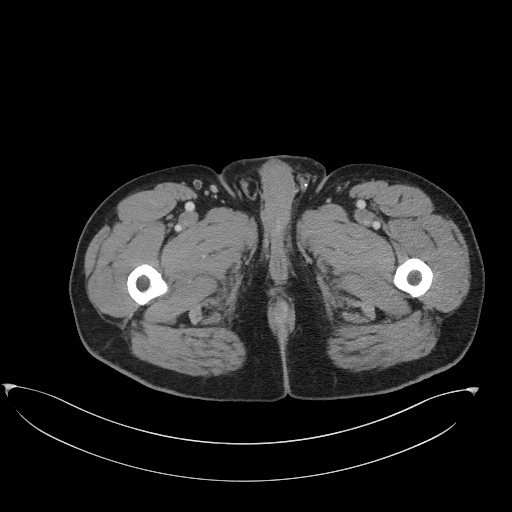
[im 5/93  bone]
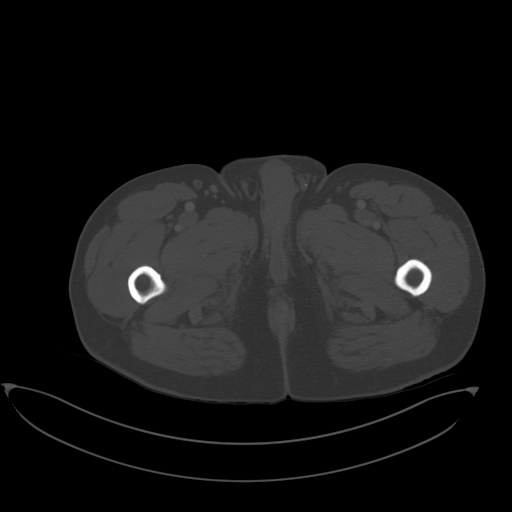
[im 14/93  soft-tissue]
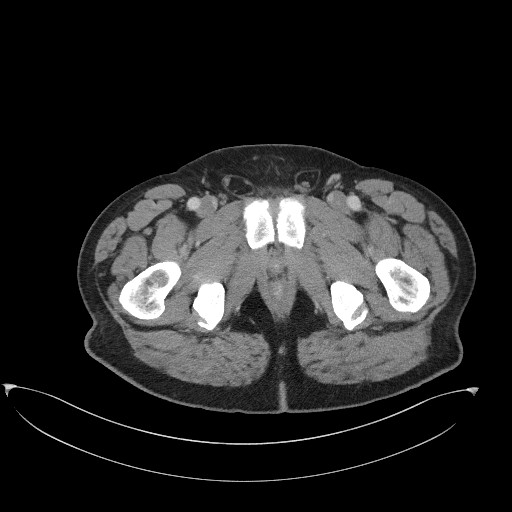
[im 19/93  soft-tissue]
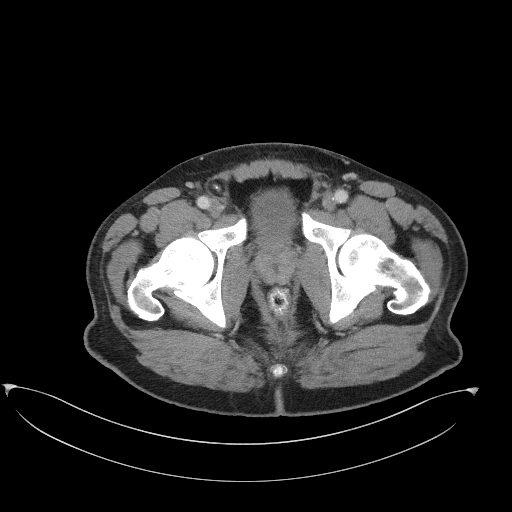
[im 28/93  soft-tissue]
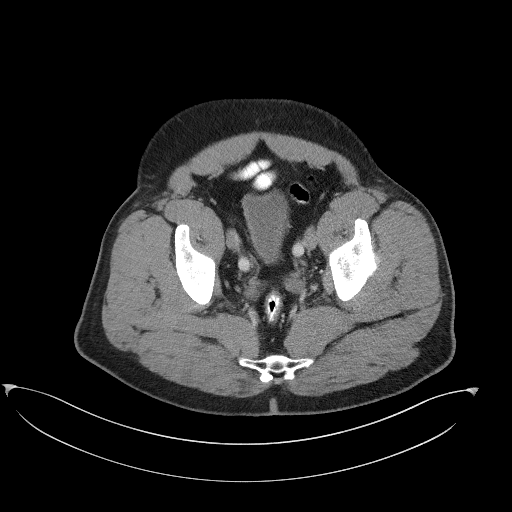
[im 33/93  soft-tissue]
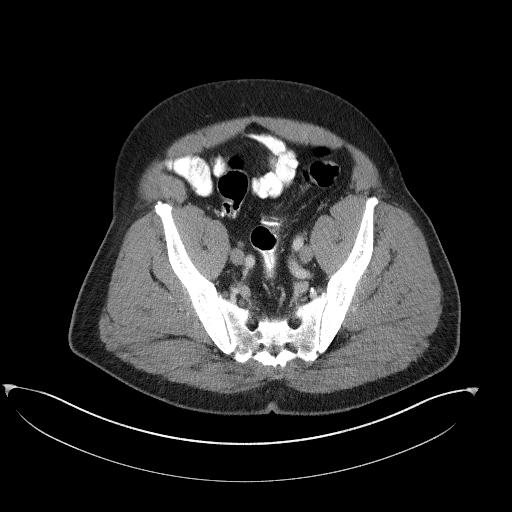
[im 42/93  soft-tissue]
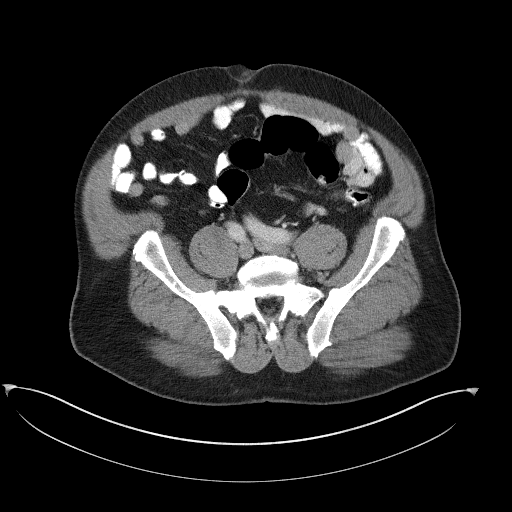
[im 47/93  soft-tissue]
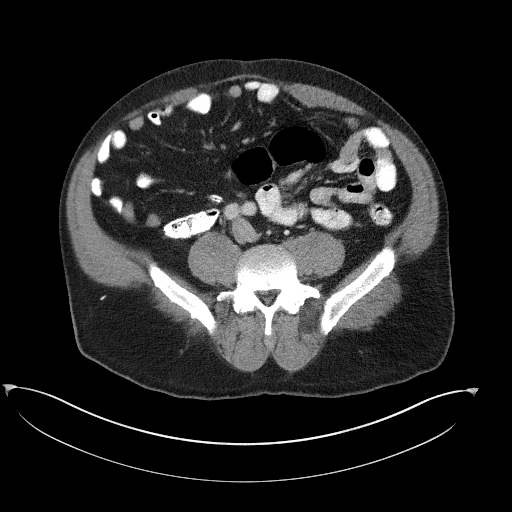
[im 51/93  soft-tissue]
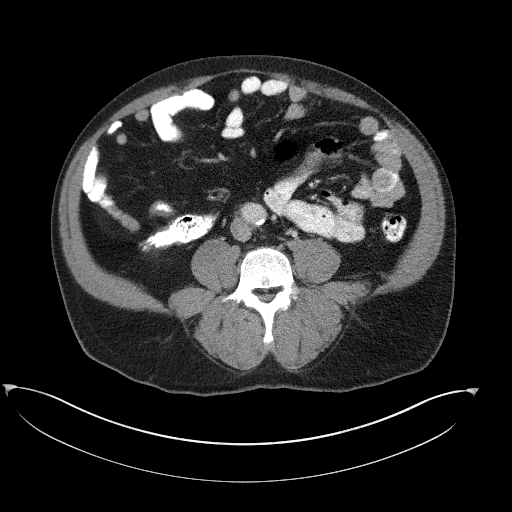
[im 60/93  soft-tissue]
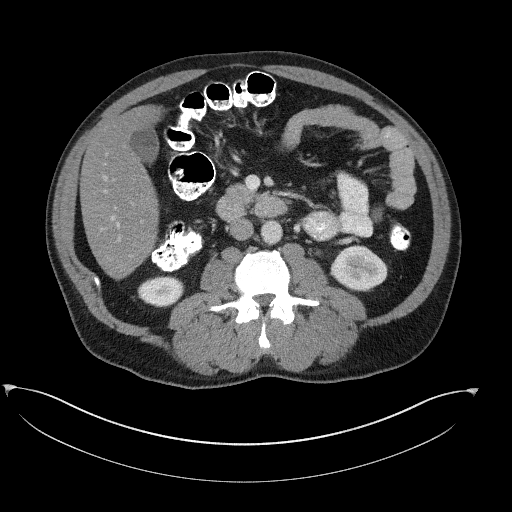
[im 60/93  bone]
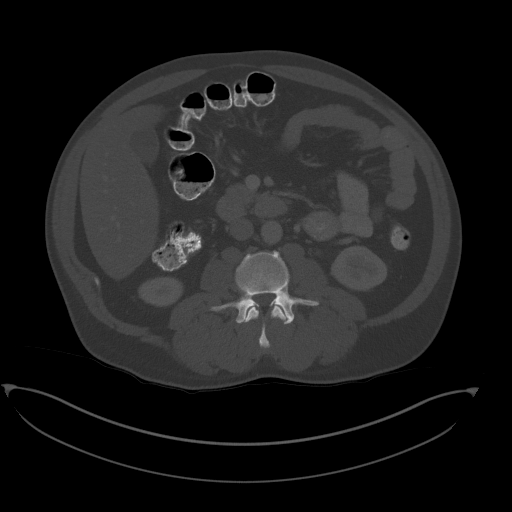
[im 65/93  soft-tissue]
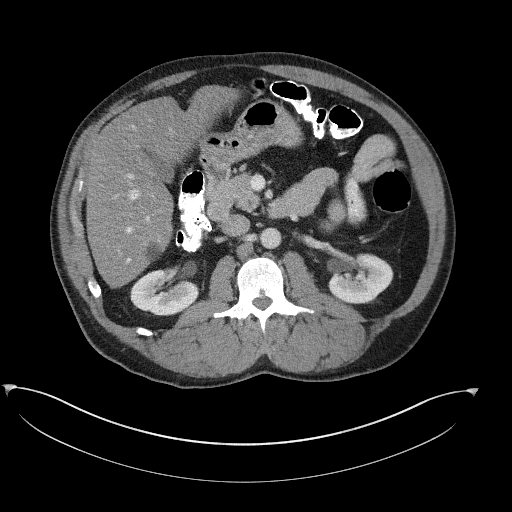
[im 74/93  soft-tissue]
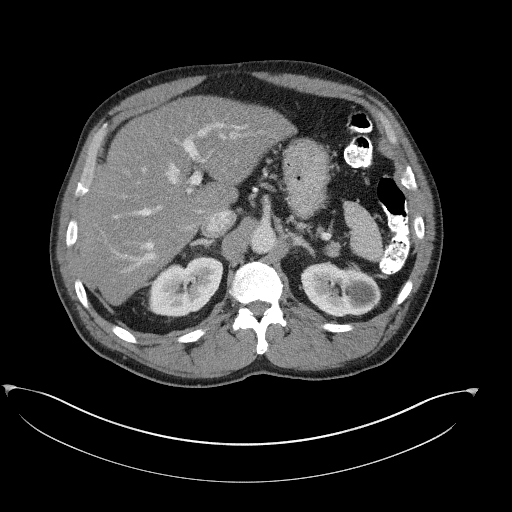
[im 79/93  soft-tissue]
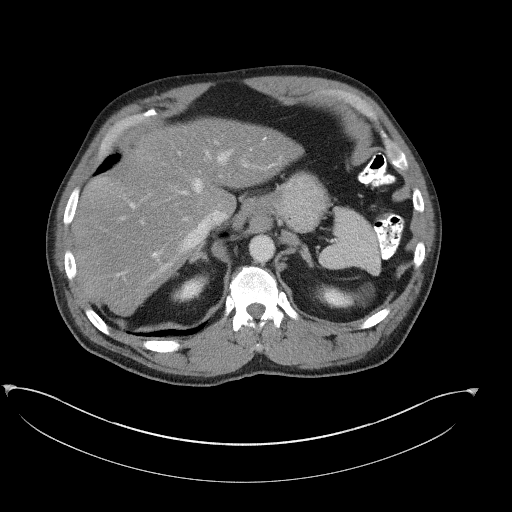
[im 88/93  soft-tissue]
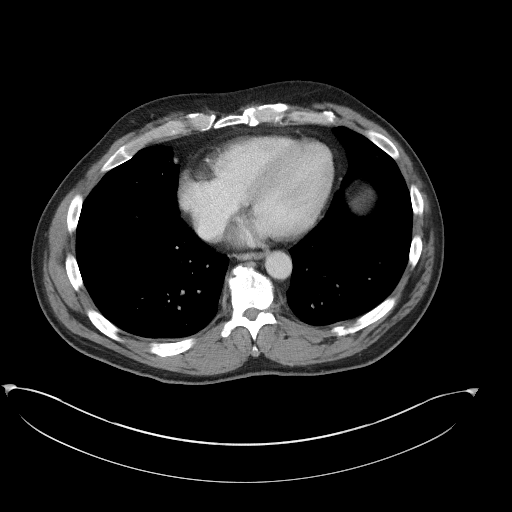

[Series 5: coronal st · coronal · 0.87mm/px · 3 of 124 slices shown]
[im 42/124  soft-tissue]
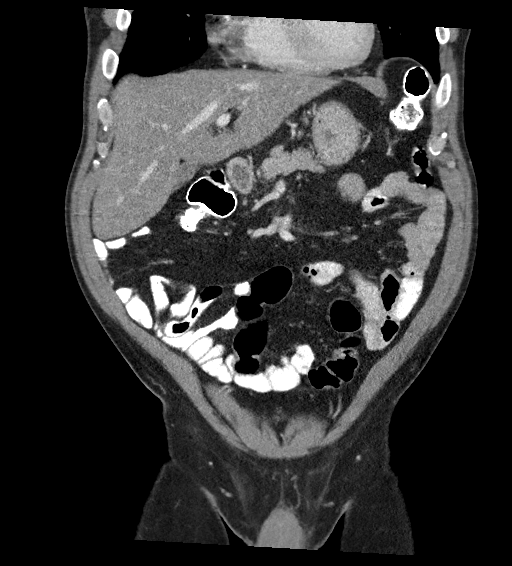
[im 55/124  soft-tissue]
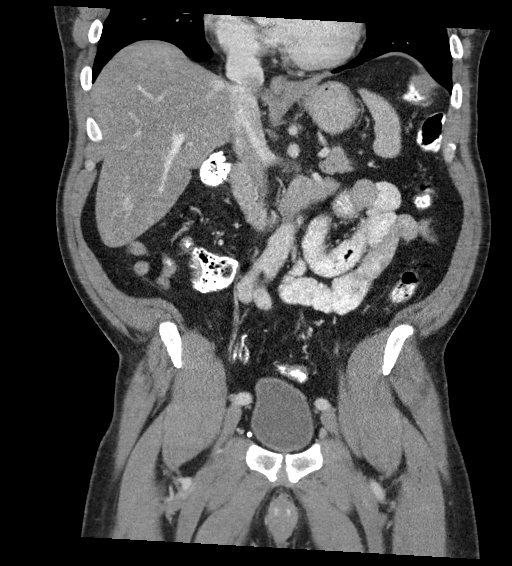
[im 69/124  soft-tissue]
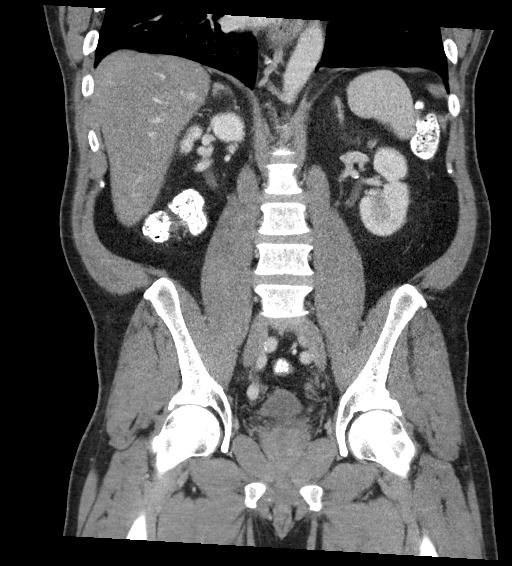

[16 of 46 positions shown; findings below may reference images not displayed]

FINDINGS: Lower chest: No acute abnormality.

Hepatobiliary: Diffuse hepatic steatosis. There is a low-density
lesion within the posterior right hepatic lobe (series 2, image 29,
density consistent with a cyst. No gallstones, gallbladder wall
thickening, or biliary dilatation.

Pancreas: Unremarkable. No pancreatic ductal dilatation or
surrounding inflammatory changes.

Spleen: Normal in size without focal abnormality.

Adrenals/Urinary Tract: Adrenal glands are unremarkable. No
hydronephrosis or nephrolithiasis. Simple left renal cyst. Bladder
is unremarkable.

Stomach/Bowel: The stomach is within normal limits. There is no
evidence of bowel obstruction. The appendix is normal.

Vascular/Lymphatic: No significant vascular findings are present. No
enlarged abdominal or pelvic lymph nodes.

Reproductive: Unremarkable.

Other: There is a small umbilical hernia containing a single wall of
small bowel. Suggestion of mild stranding within the hernia fat.

Musculoskeletal: No acute osseous abnormality. Multiple old healed
rib fractures. Multilevel degenerative changes of the spine. Mild
bilateral hip degenerative changes. No suspicious lytic or blastic
lesion.
IMPRESSION: Small umbilical hernia containing a single wall of small-bowel and
possible mild inflammation within herniated fat. Correlate with
hernia tenderness and reducibility. No evidence of bowel obstruction
or other acute process.

Diffuse hepatic steatosis.

## 2023-02-25 NOTE — Progress Notes (Deleted)
Luis Gilbert, male    DOB: Jul 03, 1965    MRN: [id number]   Brief patient profile:  49  yobm  active smoker  referred to pulmonary clinic in [place]  01/08/2023 by Dr Everlene Other for cough  with Neg CTa chest 09/22/2022 (done for L MSCP) with severe coughing to point of L MSCP and syncope off lisinopril x 2022   PE  / L DVT  04/2019  with demand ischemia/ mild decreased RV fx   History of Present Illness  01/08/2023  Pulmonary/ 1st office eval/ Matea Stanard / [facility] on dpi albuterol  Chief Complaint  Patient presents with   Establish Care  Dyspnea:  walks 100 ft uphill to [employer] [place] and does not have to stop  Cough: resolved at present / no obvious pattern/ paroxysmal  min productive mostly daytime Sleep: flat bed / 3 pillows s resp cc  SABA use: last used 01/05/23  02: none Lung cancer screen: CTa 09/22/22 neg  Rec Symbicort 80 is up to 2 puffs every 12 hours until better for a full week then ok to taper off. Best cough medication > mucinex DM 1200 mg every 12 hours as needed  Try prilosec otc 20mg   Take 30-60 min before first meal of the day and Pepcid ac (famotidine) 20 mg one after supper  until cough is completely gone for at least a week without the need for cough suppression Follow up your blood pressure asap with your PCP The key is to stop smoking completely before smoking completely stops you! Please schedule a follow up office visit in 6 weeks, call sooner if needed with all medications /inhalers/ solutions in hand     02/26/2023  f/u ov/[place] office/Jahniya Duzan re: *** maint on *** did *** bring meds  No chief complaint on file.   Dyspnea:  *** Cough: *** Sleeping: ***   resp cc  SABA use: *** 02: ***  Lung cancer screening: ***   No obvious day to day or daytime variability or assoc excess/ purulent sputum or mucus plugs or hemoptysis or cp or chest tightness, subjective wheeze or overt sinus or hb symptoms.    Also denies any obvious  fluctuation of symptoms with weather or environmental changes or other aggravating or alleviating factors except as outlined above   No unusual exposure hx or h/o childhood pna/ asthma or knowledge of premature birth.  Current Allergies, Complete Past Medical History, Past Surgical History, Family History, and Social History were reviewed in [employer] record.  ROS  The following are not active complaints unless bolded Hoarseness, sore throat, dysphagia, dental problems, itching, sneezing,  nasal congestion or discharge of excess mucus or purulent secretions, ear ache,   fever, chills, sweats, unintended wt loss or wt gain, classically pleuritic or exertional cp,  orthopnea pnd or arm/hand swelling  or leg swelling, presyncope, palpitations, abdominal pain, anorexia, nausea, vomiting, diarrhea  or change in bowel habits or change in bladder habits, change in stools or change in urine, dysuria, hematuria,  rash, arthralgias, visual complaints, headache, numbness, weakness or ataxia or problems with walking or coordination,  change in mood or  memory.        No outpatient medications have been marked as taking for the 02/26/23 encounter (Appointment) with Nyoka Cowden, MD.           Past Medical History:  Diagnosis Date   COPD (chronic obstructive pulmonary disease) (HCC)    Hyperlipidemia  Hypertension    PE (pulmonary thromboembolism) (HCC) 04/2019      Objective:    Wts   02/26/2023     ***   01/08/23 210 lb (95.3 kg)  12/05/22 203 lb (92.1 kg)  11/13/22 209 lb (94.8 kg)      Vital signs reviewed  02/26/2023  - Note at rest 02 sats  ***% on ***   General appearance:    ***      scattered insp/exp rhonchi bilaterally ***  I personally reviewed images and agree with radiology impression as follows:   Chest CTa   Neg 09/22/2022       Assessment

## 2023-02-26 ENCOUNTER — Ambulatory Visit: Payer: Medicaid Other | Admitting: Internal Medicine

## 2023-03-07 ENCOUNTER — Ambulatory Visit: Payer: Medicaid Other | Admitting: Family Medicine

## 2023-03-13 ENCOUNTER — Ambulatory Visit: Payer: Medicaid Other | Admitting: Family Medicine

## 2023-03-13 ENCOUNTER — Encounter: Payer: Self-pay | Admitting: Family Medicine

## 2023-03-13 VITALS — BP 146/86 | Temp 98.1°F | Ht 69.0 in

## 2023-03-13 DIAGNOSIS — I10 Essential (primary) hypertension: Secondary | ICD-10-CM | POA: Diagnosis not present

## 2023-03-13 DIAGNOSIS — G8929 Other chronic pain: Secondary | ICD-10-CM | POA: Diagnosis not present

## 2023-03-13 DIAGNOSIS — M545 Low back pain, unspecified: Secondary | ICD-10-CM

## 2023-03-13 DIAGNOSIS — K429 Umbilical hernia without obstruction or gangrene: Secondary | ICD-10-CM

## 2023-03-13 NOTE — Patient Instructions (Signed)
Referrals placed.   Follow up in 6 months.   Take care  Dr. Lacinda Axon

## 2023-03-14 DIAGNOSIS — K429 Umbilical hernia without obstruction or gangrene: Secondary | ICD-10-CM | POA: Insufficient documentation

## 2023-03-14 DIAGNOSIS — F1911 Other psychoactive substance abuse, in remission: Secondary | ICD-10-CM | POA: Insufficient documentation

## 2023-03-14 NOTE — Progress Notes (Signed)
 Subjective:  Patient ID: Luis Gilbert, male    DOB: Jan 29, 1966  Age: 58 y.o. MRN: [id number]  CC:   Chief Complaint  Patient presents with   Follow-up    For syncope. No recent episodes   Hernia    Swollen x's 1 month. Not painful to touch currently.     HPI:  58 year old male presents for follow-up.  Patient has been doing better.  He has been seen by pulmonology.  He has had no recent episodes of cough syncope.  Continues to smoke.  Is now on Symbicort .  He does report that he is having some intermittent discomfort from umbilical hernia.  He would like to discuss this today.  Additionally, patient reports continued low back pain.  He has known chronic low back pain.  Has previously been seen by neurosurgery who recommended referral to pain management.  Will discuss this with him today.  Patient Active Problem List   Diagnosis Date Noted   History of substance abuse (HCC) 03/14/2023   Umbilical hernia without obstruction and without gangrene 03/14/2023   Asthmatic bronchitis , chronic (HCC) 01/08/2023   Cigarette smoker 01/08/2023   Cough syncope likely related to UACS 11/13/2022   CAD (coronary artery disease) 05/12/2022   Aortic atherosclerosis (HCC) 05/12/2022   Alcohol abuse 05/12/2022   Essential hypertension 11/28/2021   Hyperlipidemia 11/28/2021   Chronic low back pain 11/28/2021   Hepatic steatosis 05/01/2019   Pulmonary embolism (HCC) 04/28/2019    Social Hx   Social History   Socioeconomic History   Marital status: Married    Spouse name: Not on file   Number of children: Not on file   Years of education: Not on file   Highest education level: Not on file  Occupational History   Not on file  Tobacco Use   Smoking status: Every Day    Current packs/day: 1.00    Average packs/day: 1 pack/day for 20.0 years (20.0 ttl pk-yrs)    Types: Cigarettes   Smokeless tobacco: Never  Vaping Use   Vaping status: Never Used  Substance and Sexual Activity    Alcohol use: Yes    Comment: drinks 1- 40 oz daily   Drug use: Yes    Types: Marijuana    Comment: MJ qod.  no cocaine since 04/2019   Sexual activity: Yes  Other Topics Concern   Not on file  Social History Narrative   Not on file   Social Drivers of Health   Financial Resource Strain: Not on file  Food Insecurity: Not on file  Transportation Needs: Not on file  Physical Activity: Not on file  Stress: Not on file  Social Connections: Not on file    Review of Systems Per HPI  Objective:  BP (!) 146/86   Temp 98.1 F (36.7 C)   Ht 5' 9 (1.753 m)   SpO2 98%   BMI 31.01 kg/m      03/13/2023    2:46 PM 01/08/2023    9:21 AM 12/05/2022    2:34 PM  BP/Weight  Systolic BP 146 165 136  Diastolic BP 86 105 84  Wt. (Lbs)  210 203  BMI  31.01 kg/m2 29.98 kg/m2    Physical Exam Vitals and nursing note reviewed.  Constitutional:      General: He is not in acute distress.    Appearance: Normal appearance.  HENT:     Head: Normocephalic and atraumatic.  Eyes:     General:  Right eye: No discharge.        Left eye: No discharge.     Conjunctiva/sclera: Conjunctivae normal.  Cardiovascular:     Rate and Rhythm: Normal rate and regular rhythm.  Pulmonary:     Effort: Pulmonary effort is normal.     Breath sounds: Normal breath sounds. No wheezing, rhonchi or rales.  Abdominal:     Comments: Reducible umbilical hernia.  Neurological:     Mental Status: He is alert.     Lab Results  Component Value Date   WBC 3.4 11/13/2022   HGB 15.4 11/13/2022   HCT 43.8 11/13/2022   PLT 187 11/13/2022   GLUCOSE 87 11/13/2022   CHOL 150 11/13/2022   TRIG 79 11/13/2022   HDL 63 11/13/2022   LDLCALC 72 11/13/2022   ALT 114 (H) 11/13/2022   AST 94 (H) 11/13/2022   NA 130 (L) 11/13/2022   K 3.7 11/13/2022   CL 84 (L) 11/13/2022   CREATININE 1.29 (H) 11/13/2022   BUN 9 11/13/2022   CO2 26 11/13/2022   INR 1.0 04/28/2019   HGBA1C 5.8 (H) 11/13/2022      Assessment & Plan:   Problem List Items Addressed This Visit       Cardiovascular and Mediastinum   Essential hypertension   BP mildly elevated here today.  Continue amlodipine .  Will continue to follow closely.      Relevant Medications   amLODipine  (NORVASC ) 10 MG tablet     Other   Umbilical hernia without obstruction and without gangrene - Primary   Patient wants this repaired.  Referring to general surgery.      Relevant Orders   Ambulatory referral to General Surgery   Chronic low back pain   Referring to pain management.      Relevant Orders   Ambulatory referral to Pain Clinic    Follow-up:  6 months  Rj Pedrosa Bluford DO [facility]

## 2023-03-14 NOTE — Assessment & Plan Note (Signed)
 BP mildly elevated here today.  Continue amlodipine .  Will continue to follow closely.

## 2023-03-14 NOTE — Assessment & Plan Note (Signed)
 Patient wants this repaired.  Referring to general surgery.

## 2023-03-14 NOTE — Assessment & Plan Note (Signed)
Referring to pain management

## 2023-03-19 ENCOUNTER — Encounter: Payer: Self-pay | Admitting: Physical Medicine & Rehabilitation

## 2023-03-22 ENCOUNTER — Encounter: Payer: Self-pay | Admitting: Surgery

## 2023-03-22 ENCOUNTER — Ambulatory Visit: Payer: Medicaid Other | Admitting: Surgery

## 2023-03-22 ENCOUNTER — Encounter: Payer: Self-pay | Admitting: *Deleted

## 2023-03-22 ENCOUNTER — Telehealth: Payer: Self-pay

## 2023-03-22 VITALS — BP 134/80 | HR 97 | Temp 97.5°F | Resp 18 | Ht 69.0 in | Wt 228.0 lb

## 2023-03-22 DIAGNOSIS — I2510 Atherosclerotic heart disease of native coronary artery without angina pectoris: Secondary | ICD-10-CM

## 2023-03-22 DIAGNOSIS — K429 Umbilical hernia without obstruction or gangrene: Secondary | ICD-10-CM | POA: Diagnosis not present

## 2023-03-22 NOTE — Telephone Encounter (Signed)
Correct fax number is [phone]. Will refax notes

## 2023-03-22 NOTE — Progress Notes (Signed)
[facility] History and Physical  Reason for Referral: Umbilical hernia Referring Physician: Dr. Adriana Simas  Chief Complaint   New Patient (Initial Visit)     Luis Gilbert is a 58 y.o. male.  HPI: Patient presents for evaluation of an umbilical hernia.  He first noticed the hernia 4 to 5 years ago but over the last couple of months it has been increasing in pain.  About 2 months ago he did have an episode where the hernia increased in size and was much more tender and this resolved over about a week and a half without any further evaluation.  He denies any nausea or vomiting and he is having regular bowel movements.  He has never tried to reduce the hernia and denies any other issues related to the hernia.  His past medical history is significant for hypertension, hyperlipidemia, COPD, and a heart attack in addition to DVT/PE at the same time.  He denies ever having any cardiac catheterizations or stent placement.  He is currently on Xarelto for his history of DVT/PE.  He has not seen a cardiologist in greater than a year.  He has no history of any abdominal surgeries.  He does have a history of a left arm surgery.  He smokes 1/2 pack of cigarettes per day and uses marijuana 2 times a week.  He denies use of alcohol.  Past Medical History:  Diagnosis Date   COPD (chronic obstructive pulmonary disease) (HCC)    Hyperlipidemia    Hypertension    PE (pulmonary thromboembolism) (HCC) 04/2019    Past Surgical History:  Procedure Laterality Date   ARM SURGERY      Family History  Problem Relation Age of Onset   Cancer Mother    Cancer Father    Cancer Maternal Uncle    Hypertension Maternal Uncle    Cancer Paternal Uncle    Hypertension Paternal Uncle     Social History   Tobacco Use   Smoking status: Every Day    Current packs/day: 1.00    Average packs/day: 1 pack/day for 20.0 years (20.0 ttl pk-yrs)    Types: Cigarettes   Smokeless tobacco: Never  Vaping Use    Vaping status: Never Used  Substance Use Topics   Alcohol use: Yes    Comment: drinks 1- 40 oz daily   Drug use: Yes    Types: Marijuana    Comment: MJ qod.  no cocaine since 04/2019    Medications: I have reviewed the patient's current medications. Allergies as of 03/22/2023       Reactions   Tomato Swelling   Lisinopril    Other reaction(s): lip swelling        Medication List        Accurate as of March 22, 2023 10:44 AM. If you have any questions, ask your nurse or doctor.          amitriptyline 50 MG tablet Commonly known as: ELAVIL Take 50 mg by mouth at bedtime as needed.   amLODipine 10 MG tablet Commonly known as: NORVASC Take 10 mg by mouth daily.   budesonide-formoterol 80-4.5 MCG/ACT inhaler Commonly known as: Symbicort Take 2 puffs first thing in am and then another 2 puffs about 12 hours later.   Metoprolol Tartrate 37.5 MG Tabs Take 37.5 mg by mouth 2 (two) times daily.   nitroGLYCERIN 0.4 MG SL tablet Commonly known as: NITROSTAT as neede for chest pain; may repeat q 5 min x 3 prn; go  to ED if still having chest pain   rivaroxaban 10 MG Tabs tablet Commonly known as: Xarelto Take 1 tablet (10 mg total) by mouth daily.         ROS:  Constitutional: negative for chills, fatigue, and fevers Eyes: negative for visual disturbance and pain Ears, nose, mouth, throat, and face: negative for ear drainage, sore throat, and sinus problems Respiratory: positive for shortness of breath, negative for cough and wheezing Cardiovascular: positive for chest pain, negative for palpitations Gastrointestinal: positive for abdominal pain, negative for nausea, reflux symptoms, and vomiting Genitourinary:negative for dysuria and frequency Integument/breast: negative for dryness and rash Hematologic/lymphatic: negative for bleeding and lymphadenopathy Musculoskeletal:positive for back pain and neck pain Neurological: negative for dizziness and  tremors Endocrine: negative for temperature intolerance  Blood pressure 134/80, pulse 97, temperature (!) 97.5 F (36.4 C), temperature source Oral, resp. rate 18, height 5\' 9"  (1.753 m), weight 228 lb (103.4 kg), SpO2 95%. Physical Exam Vitals reviewed.  Constitutional:      Appearance: Normal appearance.  HENT:     Head: Normocephalic and atraumatic.  Eyes:     Extraocular Movements: Extraocular movements intact.     Pupils: Pupils are equal, round, and reactive to light.  Cardiovascular:     Rate and Rhythm: Normal rate and regular rhythm.  Pulmonary:     Effort: Pulmonary effort is normal.     Breath sounds: Normal breath sounds.  Abdominal:     Comments: Abdomen soft, nondistended, no percussion tenderness, nontender to palpation; no rigidity, guarding, rebound tenderness; tenderness at umbilical hernia site but hernia able to be reduced without issue  Musculoskeletal:        General: Normal range of motion.     Cervical back: Normal range of motion.  Skin:    General: Skin is warm and dry.  Neurological:     General: No focal deficit present.     Mental Status: He is alert and oriented to person, place, and time.  Psychiatric:        Mood and Affect: Mood normal.        Behavior: Behavior normal.     Results: No results found for this or any previous visit (from the past 48 hours).  No results found.   Assessment & Plan:  Luis Gilbert is a 58 y.o. male who presents for evaluation of an umbilical hernia.  -I discussed the pathophysiology of umbilical hernias and we discussed the need for surgical repair -The risk and benefits of robotic assisted laparoscopic umbilical hernia repair with mesh were discussed including but not limited to bleeding, infection, injury to surrounding structures, and need for additional procedures.  After careful consideration, Luis Gilbert has decided to proceed with surgery.  -Given the patient's significant cardiac history, I recommend  that he follow-up with a cardiologist prior to [place] scheduling surgery.  Appreciate cardiology risk stratification -Information provided to the patient regarding umbilical hernias -Advised that the patient should present to the ED if they begin to have painful nonreducible bulge at his umbilicus, nausea, vomiting, and obstipation   All questions were answered to the satisfaction of the patient and family.  Theophilus Kinds, DO [facility] [address] [place], [place] [address] [phone] (office)

## 2023-03-22 NOTE — Telephone Encounter (Signed)
   Name: Luis Gilbert  DOB: 1965/06/17  MRN: [id number]  Primary Cardiologist: None  Chart reviewed as part of pre-operative protocol coverage. The patient has an upcoming visit scheduled with Dr. Wyline Mood on 05/14/2023 at which time clearance can be addressed in case there are any issues that would impact surgical recommendations.  I added preop FYI to appointment note so that provider is aware to address at time of outpatient visit.  Per office protocol the cardiology provider should forward their finalized clearance decision and recommendations regarding antiplatelet therapy to the requesting party below.    Xarelto is not managed by cardiology and guidance should come from prescribing provider.  Please call with any questions.  Napoleon Form, Leodis Rains, NP  03/22/2023, 1:52 PM

## 2023-03-22 NOTE — Telephone Encounter (Signed)
   Pre-operative Risk Assessment    Patient Name: Luis Gilbert  DOB: 05-10-1965 MRN: [id number]   Date of last office visit: 11/17/21 Date of next office visit: 05/14/23    Request for Surgical Clearance    Procedure:   Umbilical hernia repair w/mesh  Date of Surgery:  Clearance TBD                                 Surgeon:  Santina Evans Pappayliou. DO  Surgeon's Group or Practice Name:  [facility]  Phone number:  [phone] Fax number:  [phone]   Type of Clearance Requested:   - Medical  - Pharmacy:  Hold Rivaroxaban (Xarelto) Not indicated    Type of Anesthesia:  General    Additional requests/questions:    [name]   03/22/2023, 1:47 PM

## 2023-04-19 ENCOUNTER — Encounter: Payer: Medicare Other | Admitting: Physical Medicine & Rehabilitation

## 2023-04-19 NOTE — Progress Notes (Deleted)
 Subjective:    Patient ID: Luis Gilbert, male    DOB: 11-06-1965, 58 y.o.   MRN: [id number]  HPI  Pain Inventory Average Pain {NUMBERS; 0-10:5044} Pain Right Now {NUMBERS; 0-10:5044} My pain is {PAIN DESCRIPTION:21022940}  In the last 24 hours, has pain interfered with the following? General activity {NUMBERS; 0-10:5044} Relation with others {NUMBERS; 0-10:5044} Enjoyment of life {NUMBERS; 0-10:5044} What TIME of day is your pain at its worst? {time of day:24191} Sleep (in general) {BHH GOOD/FAIR/POOR:22877}  Pain is worse with: {ACTIVITIES:21022942} Pain improves with: {PAIN IMPROVES ZDGU:44034742} Relief from Meds: {NUMBERS; 0-10:5044}  {MOBILITY VZD:63875643}  {FUNCTION:21022946}  {NEURO/PSYCH:21022948}  {CPRM PRIOR STUDIES:21022953}  {CPRM PHYSICIANS INVOLVED IN YOUR CARE:21022954}    Family History  Problem Relation Age of Onset   Cancer Mother    Cancer Father    Cancer Maternal Uncle    Hypertension Maternal Uncle    Cancer Paternal Uncle    Hypertension Paternal Uncle    Social History   Socioeconomic History   Marital status: Married    Spouse name: Not on file   Number of children: Not on file   Years of education: Not on file   Highest education level: Not on file  Occupational History   Not on file  Tobacco Use   Smoking status: Every Day    Current packs/day: 1.00    Average packs/day: 1 pack/day for 20.0 years (20.0 ttl pk-yrs)    Types: Cigarettes   Smokeless tobacco: Never  Vaping Use   Vaping status: Never Used  Substance and Sexual Activity   Alcohol use: Yes    Comment: drinks 1- 40 oz daily   Drug use: Yes    Types: Marijuana    Comment: MJ qod.  no cocaine since 04/2019   Sexual activity: Yes  Other Topics Concern   Not on file  Social History Narrative   Not on file   Social Drivers of Health   Financial Resource Strain: Not on file  Food Insecurity: Not on file  Transportation Needs: Not on file  Physical Activity:  Not on file  Stress: Not on file  Social Connections: Not on file   Past Surgical History:  Procedure Laterality Date   ARM SURGERY     Past Medical History:  Diagnosis Date   COPD (chronic obstructive pulmonary disease) (HCC)    Hyperlipidemia    Hypertension    PE (pulmonary thromboembolism) (HCC) 04/2019   There were no vitals taken for this visit.  Opioid Risk Score:   Fall Risk Score:  `1  Depression screen [facility] 2/9     03/13/2023    2:56 PM 11/13/2022    8:42 AM 05/12/2022    9:55 AM 12/21/2021    1:30 PM 11/28/2021    1:24 PM  Depression screen PHQ 2/9  Decreased Interest 0 0 0 0 0  Down, Depressed, Hopeless 0 0 0 0 0  PHQ - 2 Score 0 0 0 0 0  Altered sleeping 0 0 0    Tired, decreased energy 0 0 2    Change in appetite 0 0 0    Feeling bad or failure about yourself  0 0 3    Trouble concentrating 0 0 0    Moving slowly or fidgety/restless 0 0 0    Suicidal thoughts 0 0 0    PHQ-9 Score 0 0 5    Difficult doing work/chores Not difficult at all Not difficult at all Somewhat difficult  Review of Systems     Objective:   Physical Exam        Assessment & Plan:

## 2023-04-26 ENCOUNTER — Other Ambulatory Visit: Payer: Self-pay | Admitting: Family Medicine

## 2023-05-01 NOTE — Progress Notes (Deleted)
  Cardiology Office Note:  .   Date:  05/01/2023  ID:  Luis Gilbert, DOB [birth year], MRN [id number] PCP: Tommie Sams, DO  [facility] Providers Cardiologist:  None { Click to update primary MD,subspecialty MD or APP then REFRESH:1}   History of Present Illness: .   Luis Gilbert is a 58 y.o. male with history of syncope during heavy coughing spells, PE 2021 on lifelong Xarelto, ETOH, LE edema, HTN, Coronary atherosclerosis noted on CT.   Preop clearance for umbilical hernia repair with mesh by Dr. Theophilus Kinds . Xarelto not managed by [place].   ROS: ***  Studies Reviewed: Marland Kitchen         Prior CV Studies: {Select studies to display:26339}   04/2019 echo 1. Left ventricular ejection fraction, by estimation, is 55 to 60%. The  left ventricle has normal function. The left ventricle has no regional  wall motion abnormalities. There is mild concentric left ventricular  hypertrophy. Left ventricular diastolic  parameters are consistent with Grade I diastolic dysfunction (impaired  relaxation).   2. The right ventricular free wall is mildly hypokinetic, but the apex  contracts normally (McConnell's sign), consistent with acute pulmonary  embolism. Right ventricular systolic function is mildly reduced. The right  ventricular size is mildly enlarged.   There is mildly elevated pulmonary artery systolic pressure. The  estimated right ventricular systolic pressure is 36.1 mmHg.   3. Right atrial size was mildly dilated.   4. The mitral valve is normal in structure and function. No evidence of  mitral valve regurgitation.   5. The aortic valve is normal in structure and function. Aortic valve  regurgitation is not visualized.   6. The inferior vena cava is normal in size with greater than 50%  respiratory variability, suggesting right atrial pressure of 3 mmHg.     Risk Assessment/Calculations:   {Does this patient have ATRIAL FIBRILLATION?[phone]} No BP recorded.  {Refresh  Note OR Click here to enter BP  :1}***       Physical Exam:   VS:  There were no vitals taken for this visit.   Wt Readings from Last 3 Encounters:  03/22/23 228 lb (103.4 kg)  01/08/23 210 lb (95.3 kg)  12/05/22 203 lb (92.1 kg)    GEN: Well nourished, well developed in no acute distress NECK: No JVD; No carotid bruits CARDIAC: ***RRR, no murmurs, rubs, gallops RESPIRATORY:  Clear to auscultation without rales, wheezing or rhonchi  ABDOMEN: Soft, non-tender, non-distended EXTREMITIES:  No edema; No deformity   ASSESSMENT AND PLAN: .    Preop clearance for umbilical hernia repair with mesh by Dr. Theophilus Kinds . Xarelto not managed by [place].  History of PE 2021 on lifelong Xarelto  Aortic atherosclerosis noted on CT  History of postussive syncope    HTN  Le edema  ETOH     {Are you ordering a CV Procedure (e.g. stress test, cath, DCCV, TEE, etc)?   Press F2        :[id number]}  Dispo: ***  Signed, Jacolyn Reedy, PA-C

## 2023-05-14 ENCOUNTER — Encounter: Payer: Self-pay | Admitting: Physician Assistant

## 2023-05-14 ENCOUNTER — Ambulatory Visit: Payer: Medicaid Other | Attending: Physician Assistant | Admitting: Physician Assistant

## 2023-05-25 ENCOUNTER — Encounter: Payer: Self-pay | Admitting: Physical Medicine & Rehabilitation

## 2023-05-25 ENCOUNTER — Encounter: Payer: Medicare Other | Attending: Physical Medicine & Rehabilitation | Admitting: Physical Medicine & Rehabilitation

## 2023-05-25 VITALS — BP 157/86 | HR 100 | Ht 69.0 in | Wt 224.8 lb

## 2023-05-25 DIAGNOSIS — M5442 Lumbago with sciatica, left side: Secondary | ICD-10-CM | POA: Diagnosis not present

## 2023-05-25 DIAGNOSIS — G8929 Other chronic pain: Secondary | ICD-10-CM | POA: Diagnosis not present

## 2023-05-25 MED ORDER — GABAPENTIN 100 MG PO CAPS: 100.0000 mg | ORAL_CAPSULE | Freq: Two times a day (BID) | ORAL | 2 refills | Status: DC

## 2023-05-25 NOTE — Progress Notes (Signed)
 Subjective:    Patient ID: Luis Gilbert, male    DOB: August 04, 1965, 58 y.o.   MRN: [id number]  HPI >38yr h of low back back pain which over the last 2-3 years has started radiating to the left leg, just below the knee.  The patient states that he injured his back when he was young by lifting too much.  Back pain fairly constant and LLE mainly when standing.  No weakness noted in Left foot, ankle and knee.  No bowel or bladder dysfunction denies hx of arthritis in LEs  Has not tried PT or injections, patient states he talked to a friend that told him injections did not work for him. The patient has tried over-the-counter analgesics which she states were not helpful for him. Tried tylenol  1/2 PPD smoker ETOH 1 beer per day (denies drinking more in past) , chart indicate hx of ETOH abuse Hx of cocaine abuse  Functional status needs some assistance with shopping household duties and meal prep but otherwise is independent.  He does not do any type of exercise. CLINICAL DATA:  Lumbar radiculopathy.  No prior back surgery.   EXAM: MRI LUMBAR SPINE WITHOUT CONTRAST   TECHNIQUE: Multiplanar, multisequence MR imaging of the lumbar spine was performed. No intravenous contrast was administered.   COMPARISON:  None.   FINDINGS: Segmentation:  5 lumbar segments   Alignment:  Normal   Vertebrae:  Normal bone marrow.  Negative for fracture or mass   Conus medullaris and cauda equina: Conus extends to the L1-2 level. Conus and cauda equina appear normal.   Paraspinal and other soft tissues: No retroperitoneal mass or adenopathy.   Fatty atrophy in the left paraspinous muscle at the level of the iliac crest. This appears to be the longissimus muscle. This shows fatty signal on T1 and T2 and mild increased signal on STIR. Muscle fibers appear atrophic.   Disc levels:   L1-2: Negative   L2-3: Mild disc bulging.  Negative for stenosis   L3-4: Mild disc bulging and mild facet  degeneration. Epidural lipomatosis. Mild to moderate spinal stenosis   L4-5: Disc degeneration with disc bulging and broad-based central disc protrusion. Bilateral facet hypertrophy. Epidural lipomatosis. Moderate spinal stenosis. Mild to moderate subarticular stenosis bilaterally   L5-S1: Disc degeneration with disc bulging. Epidural lipomatosis with fat surrounding the nerve roots and thecal sac. Thecal sac is narrowed.   IMPRESSION: The patient has significant epidural lipomatosis in the lower lumbar spine. This is contributing to spinal stenosis at L3-4, L4-5, and L5-S1   There is marked atrophy of left paraspinous muscle most likely the longissimus muscle. No evidence of prior surgery. Correlate with prior injury.     Electronically Signed   By: Marlan Palau M.D.   On: 01/07/2021 14:43   Pain Inventory Average Pain 10 Pain Right Now 8 My pain is constant and aching  low back  In the last 24 hours, has pain interfered with the following? General activity 7 Relation with others 0 Enjoyment of life 0 What TIME of day is your pain at its worst? morning  and night Sleep (in general) Fair  Pain is worse with: standing and some activites Pain improves with: rest Relief from Meds: 1  use a cane how many minutes can you walk? 10-15 ability to climb steps?  yes do you drive?  no  I need assistance with the following:  meal prep, household duties, and shopping  tingling trouble walking spasms  Any changes  since last visit?  no  Any changes since last visit?  no    Family History  Problem Relation Age of Onset   Cancer Mother    Cancer Father    Cancer Maternal Uncle    Hypertension Maternal Uncle    Cancer Paternal Uncle    Hypertension Paternal Uncle    Social History   Socioeconomic History   Marital status: Married    Spouse name: Not on file   Number of children: Not on file   Years of education: Not on file   Highest education level: Not on  file  Occupational History   Not on file  Tobacco Use   Smoking status: Every Day    Current packs/day: 1.00    Average packs/day: 1 pack/day for 20.0 years (20.0 ttl pk-yrs)    Types: Cigarettes   Smokeless tobacco: Never  Vaping Use   Vaping status: Never Used  Substance and Sexual Activity   Alcohol use: Yes    Comment: drinks 1- 40 oz daily   Drug use: Yes    Types: Marijuana    Comment: MJ qod.  no cocaine since 04/2019   Sexual activity: Yes  Other Topics Concern   Not on file  Social History Narrative   Not on file   Social Drivers of Health   Financial Resource Strain: Not on file  Food Insecurity: Not on file  Transportation Needs: Not on file  Physical Activity: Not on file  Stress: Not on file  Social Connections: Not on file   Past Surgical History:  Procedure Laterality Date   ARM SURGERY     Past Medical History:  Diagnosis Date   COPD (chronic obstructive pulmonary disease) (HCC)    Hyperlipidemia    Hypertension    PE (pulmonary thromboembolism) (HCC) 04/2019   BP (!) 157/86   Pulse 100   Ht 5\' 9"  (1.753 m)   Wt 224 lb 12.8 oz (102 kg)   SpO2 96%   BMI 33.20 kg/m   Opioid Risk Score:  0 Fall Risk Score:  `1  Depression screen [facility] 2/9     05/25/2023   10:30 AM 03/13/2023    2:56 PM 11/13/2022    8:42 AM 05/12/2022    9:55 AM 12/21/2021    1:30 PM 11/28/2021    1:24 PM  Depression screen PHQ 2/9  Decreased Interest 0 0 0 0 0 0  Down, Depressed, Hopeless 0 0 0 0 0 0  PHQ - 2 Score 0 0 0 0 0 0  Altered sleeping 0 0 0 0    Tired, decreased energy 0 0 0 2    Change in appetite  0 0 0    Feeling bad or failure about yourself  0 0 0 3    Trouble concentrating 0 0 0 0    Moving slowly or fidgety/restless 0 0 0 0    Suicidal thoughts 0 0 0 0    PHQ-9 Score 0 0 0 5    Difficult doing work/chores  Not difficult at all Not difficult at all Somewhat difficult      Review of Systems  Musculoskeletal:  Positive for back pain and gait problem.   Hematological:  Bruises/bleeds easily.       Xarelto  All other systems reviewed and are negative.      Objective:   Physical Exam Patient declined pinprick exam of the lower extremities Patient declined palpation exam of his low back area His  lumbar range of motion is 50% flexion, 25% lumbar extension, 25% lumbar rotation Negative straight leg raise bilaterally Lower extremity strength is normal knee flexor knee extensor ankle dorsiflexor Ambulates with a cane no evidence of toe drag or knee instability Mood and affect mildly anxious General no acute distress Hip knee and ankle range of motion is normal and without pain. There is no evidence of knee effusion bilaterally or erythema. Lumbar spine shows no evidence of scoliosis or kyphosis.       Assessment & Plan:   1.  Lumbar spinal stenosis with intermittent neurogenic claudication affecting the left lower extremity.  We discussed that this is a degenerative process and not something that can be "fixed" medically.  We discussed that at this point does not appear to be severe enough to warrant surgical referral but if it progresses and his ambulation distance gets worse this may be an option.  He has not tried physical therapy which I think would be most appropriate starting point.  For his lower extremity would start gabapentin 100 mg twice daily and slowly titrate upward depending on tolerance. Given history of polysubstance abuse would avoid narcotic analgesics. The patient would likely benefit from lumbar injections however based on his hypersensitivity of his low back and fear of needles he does not wish to consider this option. Follow-up with nurse practitioner 2 months.  No need for further imaging at this time

## 2023-05-25 NOTE — Patient Instructions (Signed)
 PT and gabapentin (for leg pain ) will be precribed Non narcotic treatment will be used

## 2023-06-07 ENCOUNTER — Ambulatory Visit: Admitting: Cardiology

## 2023-06-26 NOTE — Therapy (Unsigned)
 OUTPATIENT PHYSICAL THERAPY THORACOLUMBAR EVALUATION   Patient Name: Luis Gilbert MRN: [id number] DOB:[birth year], 58 y.o., male Today's Date: 06/26/2023  END OF SESSION:   Past Medical History:  Diagnosis Date   COPD (chronic obstructive pulmonary disease) (HCC)    Hyperlipidemia    Hypertension    PE (pulmonary thromboembolism) (HCC) 04/2019   Past Surgical History:  Procedure Laterality Date   ARM SURGERY     Patient Active Problem List   Diagnosis Date Noted   History of substance abuse (HCC) 03/14/2023   Umbilical hernia without obstruction and without gangrene 03/14/2023   Asthmatic bronchitis , chronic (HCC) 01/08/2023   Cigarette smoker 01/08/2023   Cough syncope likely related to UACS 11/13/2022   CAD (coronary artery disease) 05/12/2022   Aortic atherosclerosis (HCC) 05/12/2022   Alcohol abuse 05/12/2022   Essential hypertension 11/28/2021   Hyperlipidemia 11/28/2021   Chronic low back pain 11/28/2021   Hepatic steatosis 05/01/2019   Pulmonary embolism (HCC) 04/28/2019    PCP: Myrna Ast, DO  REFERRING PROVIDER: Genetta Kenning, MD  REFERRING DIAG: [phone] (ICD-10-CM) - Chronic bilateral low back pain with left-sided sciatica  Rationale for Evaluation and Treatment: Rehabilitation  THERAPY DIAG:  No diagnosis found.  ONSET DATE: ***  SUBJECTIVE:                                                                                                                                                                                           SUBJECTIVE STATEMENT: ***  PERTINENT HISTORY:  ***  PAIN:  Are you having pain? {OPRCPAIN:27236}  PRECAUTIONS: {Therapy precautions:24002}  RED FLAGS: {PT Red Flags:29287}   WEIGHT BEARING RESTRICTIONS: {Yes ***/No:24003}  FALLS:  Has patient fallen in last 6 months? {fallsyesno:27318}  LIVING ENVIRONMENT: Lives with: {OPRC lives with:25569::"lives with their family"} Lives in: {Lives  in:25570} Stairs: {opstairs:27293} Has following equipment at home: {Assistive devices:23999}  OCCUPATION: ***  PLOF: {PLOF:24004}  PATIENT GOALS: ***  NEXT MD VISIT: ***  OBJECTIVE:  Note: Objective measures were completed at Evaluation unless otherwise noted.  DIAGNOSTIC FINDINGS:  IMPRESSION: The patient has significant epidural lipomatosis in the lower lumbar spine. This is contributing to spinal stenosis at L3-4, L4-5, and L5-S1   There is marked atrophy of left paraspinous muscle most likely the longissimus muscle. No evidence of prior surgery. Correlate with prior injury.  PATIENT SURVEYS:  Modified Oswestry ***   COGNITION: Overall cognitive status: {cognition:24006}     SENSATION: {sensation:27233}  MUSCLE LENGTH: Hamstrings: Right *** deg; Left *** deg Thomas test: Right *** deg; Left *** deg  POSTURE: {posture:25561}  PALPATION: ***  LUMBAR ROM:   AROM  eval  Flexion   Extension   Right lateral flexion   Left lateral flexion   Right rotation   Left rotation    (Blank rows = not tested)  LOWER EXTREMITY ROM:     {AROM/PROM:27142}  Right eval Left eval  Hip flexion    Hip extension    Hip abduction    Hip adduction    Hip internal rotation    Hip external rotation    Knee flexion    Knee extension    Ankle dorsiflexion    Ankle plant/arflexion    Ankle invers: PT Evaluation ion    Ankle eversion     (Blank rows = not tested)  LOWER EXTREMITY MMT:    MMT Right eval Left eval  Hip flexion    Hip extension    Hip abduction    Hip adduction    Hip internal rotation    Hip external rotation    Knee flexion    Knee extension    Ankle dorsiflexion    Ankle plantarflexion    Ankle inversion    Ankle eversion     (Blank rows = not tested)  LUMBAR SPECIAL TESTS:  {lumbar special test:25242}  FUNCTIONAL TESTS:  {Functional tests:24029}  GAIT: Distance walked: *** Assistive device utilized: {Assistive  devices:23999} Level of assistance: {Levels of assistance:24026} Comments: ***  TREATMENT DATE:  06/26/23: PT Evaluation and HEP                                                                                                                               PATIENT EDUCATION:  Education details: PT evaluation, objective findings, POC, Importance of HEP, Precautions, Clinic policies  Person educated: Patient Education method: Explanation and Demonstration Education comprehension: verbalized understanding and returned demonstration  HOME EXERCISE PROGRAM: ***  ASSESSMENT:  CLINICAL IMPRESSION: Patient is a 58 y.o. male who was seen today for physical therapy evaluation and treatment for M54.42,G89.29 (ICD-10-CM) - Chronic bilateral low back pain with left-sided sciatica.   OBJECTIVE IMPAIRMENTS: {opptimpairments:25111}.   ACTIVITY LIMITATIONS: {activitylimitations:27494}  PARTICIPATION LIMITATIONS: {participationrestrictions:25113}  PERSONAL FACTORS: {Personal factors:25162} are also affecting patient's functional outcome.   REHAB POTENTIAL: {rehabpotential:25112}  CLINICAL DECISION MAKING: {clinical decision making:25114}  EVALUATION COMPLEXITY: {Evaluation complexity:25115}   GOALS: Goals reviewed with patient? No  SHORT TERM GOALS: Target date: 07/11/23  Patient will be independent with performance of HEP to demonstrate adequate self management of symptoms.  Baseline:  Goal status: INITIAL  2.   Patient will report at least a 25% improvement with function or pain overall since beginning PT. Baseline:  Goal status: INITIAL   LONG TERM GOALS: Target date: 08/08/23  Patient will improve Oswestry score by     points in order to improve self-perceived disability and overall function.  Baseline: Goal status: INITIAL   2.  Patient will improve     score by     in order to   .  Baseline:  Goal status: INITIAL  3.  Patient will improve    test to   in order to improve  LE strength and endurance to return to  . Baseline:  Goal status: INITIAL  4.  Patient will gain at least    deg of AROM in     in order to improve foot clearance for safe gait mechanics Baseline:  Goal status: INITIAL  5.  Patient will report overall 50% improvement since beginning PT. Baseline:  Goal status: INITIAL   PLAN:  PT FREQUENCY: 1-2x/week  PT DURATION: 6 weeks  PLANNED INTERVENTIONS: 97164- PT Re-evaluation, 97110-Therapeutic exercises, 97530- Therapeutic activity, [id number]- Neuromuscular re-education, 97535- Self Care, 98119- Manual therapy, [phone]- Gait training, Patient/Family education, Balance training, Stair training, Spinal mobilization, and Moist heat.  PLAN FOR NEXT SESSION: ***   Nykira Reddix E Powell-Butler, PT 06/26/2023, 10:36 AM   [employer] Auth Request  Referring diagnosis code (ICD 10)? *** Treatment diagnosis codes (ICD 10)? (if different than referring diagnosis) *** What was this (referring dx) caused by? []  Surgery []  Fall []  Ongoing issue []  Arthritis []  Other: ____________  Laterality: []  Rt []  Lt []  Both  Deficits: []  Pain []  Stiffness []  Weakness []  Edema []  Balance Deficits []  Coordination []  Gait Disturbance []  ROM []  Other   Functional Tool Score: ***  CPT codes: See Planned Interventions listed in the Plan section of the Evaluation.

## 2023-06-27 ENCOUNTER — Telehealth (HOSPITAL_COMMUNITY): Payer: Self-pay

## 2023-06-27 ENCOUNTER — Ambulatory Visit (HOSPITAL_COMMUNITY)

## 2023-06-27 NOTE — Therapy (Unsigned)
 OUTPATIENT PHYSICAL THERAPY THORACOLUMBAR EVALUATION   Patient Name: Luis Gilbert MRN: [id number] DOB:[birth year], 58 y.o., male Today's Date: 06/27/2023  END OF SESSION:   Past Medical History:  Diagnosis Date   COPD (chronic obstructive pulmonary disease) (HCC)    Hyperlipidemia    Hypertension    PE (pulmonary thromboembolism) (HCC) 04/2019   Past Surgical History:  Procedure Laterality Date   ARM SURGERY     Patient Active Problem List   Diagnosis Date Noted   History of substance abuse (HCC) 03/14/2023   Umbilical hernia without obstruction and without gangrene 03/14/2023   Asthmatic bronchitis , chronic (HCC) 01/08/2023   Cigarette smoker 01/08/2023   Cough syncope likely related to UACS 11/13/2022   CAD (coronary artery disease) 05/12/2022   Aortic atherosclerosis (HCC) 05/12/2022   Alcohol abuse 05/12/2022   Essential hypertension 11/28/2021   Hyperlipidemia 11/28/2021   Chronic low back pain 11/28/2021   Hepatic steatosis 05/01/2019   Pulmonary embolism (HCC) 04/28/2019    PCP: Myrna Ast, DO  REFERRING PROVIDER: Genetta Kenning, MD  REFERRING DIAG: [phone] (ICD-10-CM) - Chronic bilateral low back pain with left-sided sciatica  Rationale for Evaluation and Treatment: Rehabilitation  THERAPY DIAG:  No diagnosis found.  ONSET DATE: ***  SUBJECTIVE:                                                                                                                                                                                           SUBJECTIVE STATEMENT: ***  PERTINENT HISTORY:  ***  PAIN:  Are you having pain? {OPRCPAIN:27236}  PRECAUTIONS: {Therapy precautions:24002}  RED FLAGS: {PT Red Flags:29287}   WEIGHT BEARING RESTRICTIONS: {Yes ***/No:24003}  FALLS:  Has patient fallen in last 6 months? {fallsyesno:27318}  LIVING ENVIRONMENT: Lives with: {OPRC lives with:25569::"lives with their family"} Lives in: {Lives  in:25570} Stairs: {opstairs:27293} Has following equipment at home: {Assistive devices:23999}  OCCUPATION: ***  PLOF: {PLOF:24004}  PATIENT GOALS: ***  NEXT MD VISIT: ***  OBJECTIVE:  Note: Objective measures were completed at Evaluation unless otherwise noted.  DIAGNOSTIC FINDINGS:  IMPRESSION: The patient has significant epidural lipomatosis in the lower lumbar spine. This is contributing to spinal stenosis at L3-4, L4-5, and L5-S1   There is marked atrophy of left paraspinous muscle most likely the longissimus muscle. No evidence of prior surgery. Correlate with prior injury.  PATIENT SURVEYS:  Modified Oswestry ***   COGNITION: Overall cognitive status: {cognition:24006}     SENSATION: {sensation:27233}  MUSCLE LENGTH: Hamstrings: Right *** deg; Left *** deg Thomas test: Right *** deg; Left *** deg  POSTURE: {posture:25561}  PALPATION: ***  LUMBAR ROM:   AROM  eval  Flexion   Extension   Right lateral flexion   Left lateral flexion   Right rotation   Left rotation    (Blank rows = not tested)  LOWER EXTREMITY ROM:     {AROM/PROM:27142}  Right eval Left eval  Hip flexion    Hip extension    Hip abduction    Hip adduction    Hip internal rotation    Hip external rotation    Knee flexion    Knee extension    Ankle dorsiflexion    Ankle plant/arflexion    Ankle invers: PT Evaluation ion    Ankle eversion     (Blank rows = not tested)  LOWER EXTREMITY MMT:    MMT Right eval Left eval  Hip flexion    Hip extension    Hip abduction    Hip adduction    Hip internal rotation    Hip external rotation    Knee flexion    Knee extension    Ankle dorsiflexion    Ankle plantarflexion    Ankle inversion    Ankle eversion     (Blank rows = not tested)  LUMBAR SPECIAL TESTS:  {lumbar special test:25242}  FUNCTIONAL TESTS:  {Functional tests:24029}  GAIT: Distance walked: *** Assistive device utilized: {Assistive  devices:23999} Level of assistance: {Levels of assistance:24026} Comments: ***  TREATMENT DATE:  06/26/23: PT Evaluation and HEP                                                                                                                               PATIENT EDUCATION:  Education details: PT evaluation, objective findings, POC, Importance of HEP, Precautions, Clinic policies  Person educated: Patient Education method: Explanation and Demonstration Education comprehension: verbalized understanding and returned demonstration  HOME EXERCISE PROGRAM: ***  ASSESSMENT:  CLINICAL IMPRESSION: Patient is a 58 y.o. male who was seen today for physical therapy evaluation and treatment for M54.42,G89.29 (ICD-10-CM) - Chronic bilateral low back pain with left-sided sciatica.   OBJECTIVE IMPAIRMENTS: {opptimpairments:25111}.   ACTIVITY LIMITATIONS: {activitylimitations:27494}  PARTICIPATION LIMITATIONS: {participationrestrictions:25113}  PERSONAL FACTORS: {Personal factors:25162} are also affecting patient's functional outcome.   REHAB POTENTIAL: {rehabpotential:25112}  CLINICAL DECISION MAKING: {clinical decision making:25114}  EVALUATION COMPLEXITY: {Evaluation complexity:25115}   GOALS: Goals reviewed with patient? No  SHORT TERM GOALS: Target date: 07/11/23  Patient will be independent with performance of HEP to demonstrate adequate self management of symptoms.  Baseline:  Goal status: INITIAL  2.   Patient will report at least a 25% improvement with function or pain overall since beginning PT. Baseline:  Goal status: INITIAL   LONG TERM GOALS: Target date: 08/08/23  Patient will improve Oswestry score by     points in order to improve self-perceived disability and overall function.  Baseline: Goal status: INITIAL   2.  Patient will improve     score by     in order to   .  Baseline:  Goal status: INITIAL  3.  Patient will improve    test to   in order to improve  LE strength and endurance to return to  . Baseline:  Goal status: INITIAL  4.  Patient will gain at least    deg of AROM in     in order to improve foot clearance for safe gait mechanics Baseline:  Goal status: INITIAL  5.  Patient will report overall 50% improvement since beginning PT. Baseline:  Goal status: INITIAL   PLAN:  PT FREQUENCY: 1-2x/week  PT DURATION: 6 weeks  PLANNED INTERVENTIONS: 97164- PT Re-evaluation, 97110-Therapeutic exercises, 97530- Therapeutic activity, [id number]- Neuromuscular re-education, 97535- Self Care, 16109- Manual therapy, [phone]- Gait training, Patient/Family education, Balance training, Stair training, Spinal mobilization, and Moist heat.  PLAN FOR NEXT SESSION: ***   Radha Coggins E Powell-Butler, PT 06/27/2023, 11:52 AM   [employer] Auth Request  Referring diagnosis code (ICD 10)? *** Treatment diagnosis codes (ICD 10)? (if different than referring diagnosis) *** What was this (referring dx) caused by? []  Surgery []  Fall []  Ongoing issue []  Arthritis []  Other: ____________  Laterality: []  Rt []  Lt []  Both  Deficits: []  Pain []  Stiffness []  Weakness []  Edema []  Balance Deficits []  Coordination []  Gait Disturbance []  ROM []  Other   Functional Tool Score: ***  CPT codes: See Planned Interventions listed in the Plan section of the Evaluation.

## 2023-06-27 NOTE — Telephone Encounter (Signed)
 Called and spoke with patient regarding missed evaluation on 06/27/23.  Patient reports he was not aware of appointment or what is was for. Once told that referral was for low back and left sided sciatica, pt reports he would like to reschedule evaluation.  Called transferred to front desk.  11:31 AM, 06/27/23 Marysue Sola, PT, DPT [facility] [place]

## 2023-07-03 ENCOUNTER — Other Ambulatory Visit: Payer: Self-pay

## 2023-07-03 ENCOUNTER — Emergency Department (HOSPITAL_COMMUNITY)

## 2023-07-03 ENCOUNTER — Encounter (HOSPITAL_COMMUNITY): Payer: Self-pay | Admitting: Emergency Medicine

## 2023-07-03 ENCOUNTER — Emergency Department (HOSPITAL_COMMUNITY)
Admission: EM | Admit: 2023-07-03 | Discharge: 2023-07-03 | Discharge status: Against Medical Advice | Attending: Emergency Medicine | Admitting: Emergency Medicine

## 2023-07-03 DIAGNOSIS — R0602 Shortness of breath: Secondary | ICD-10-CM | POA: Diagnosis not present

## 2023-07-03 DIAGNOSIS — E871 Hypo-osmolality and hyponatremia: Secondary | ICD-10-CM | POA: Insufficient documentation

## 2023-07-03 DIAGNOSIS — R079 Chest pain, unspecified: Secondary | ICD-10-CM | POA: Diagnosis not present

## 2023-07-03 DIAGNOSIS — I509 Heart failure, unspecified: Secondary | ICD-10-CM | POA: Diagnosis not present

## 2023-07-03 DIAGNOSIS — R0989 Other specified symptoms and signs involving the circulatory and respiratory systems: Secondary | ICD-10-CM | POA: Diagnosis not present

## 2023-07-03 DIAGNOSIS — R0789 Other chest pain: Secondary | ICD-10-CM | POA: Insufficient documentation

## 2023-07-03 LAB — BASIC METABOLIC PANEL WITH GFR
Anion gap: 13 (ref 5–15)
BUN: 10 mg/dL (ref 6–20)
CO2: 21 mmol/L — ABNORMAL LOW (ref 22–32)
Calcium: 8.6 mg/dL — ABNORMAL LOW (ref 8.9–10.3)
Chloride: 100 mmol/L (ref 98–111)
Creatinine, Ser: 1.03 mg/dL (ref 0.61–1.24)
GFR, Estimated: >60 mL/min (ref >60)
Glucose, Bld: 98 mg/dL (ref 70–99)
Potassium: 3.9 mmol/L (ref 3.5–5.1)
Sodium: 134 mmol/L — ABNORMAL LOW (ref 135–145)

## 2023-07-03 LAB — BRAIN NATRIURETIC PEPTIDE: B Natriuretic Peptide: 10 pg/mL (ref 0.0–100.0)

## 2023-07-03 LAB — CBC WITH DIFFERENTIAL/PLATELET
Abs Immature Granulocytes: 0.02 10*3/uL (ref 0.00–0.07)
Basophils Absolute: 0 10*3/uL (ref 0.0–0.1)
Basophils Relative: 1 %
Eosinophils Absolute: 0.1 10*3/uL (ref 0.0–0.5)
Eosinophils Relative: 2 %
HCT: 43.3 % (ref 39.0–52.0)
Hemoglobin: 15.3 g/dL (ref 13.0–17.0)
Immature Granulocytes: 0 %
Lymphocytes Relative: 52 %
Lymphs Abs: 2.4 10*3/uL (ref 0.7–4.0)
MCH: 34.4 pg — ABNORMAL HIGH (ref 26.0–34.0)
MCHC: 35.3 g/dL (ref 30.0–36.0)
MCV: 97.3 fL (ref 80.0–100.0)
Monocytes Absolute: 0.6 10*3/uL (ref 0.1–1.0)
Monocytes Relative: 14 %
Neutro Abs: 1.4 10*3/uL — ABNORMAL LOW (ref 1.7–7.7)
Neutrophils Relative %: 31 %
Platelets: 195 10*3/uL (ref 150–400)
RBC: 4.45 MIL/uL (ref 4.22–5.81)
RDW: 13.2 % (ref 11.5–15.5)
WBC: 4.6 10*3/uL (ref 4.0–10.5)
nRBC: 0 % (ref 0.0–0.2)

## 2023-07-03 LAB — TROPONIN I (HIGH SENSITIVITY): Troponin I (High Sensitivity): 11 ng/L (ref <18)

## 2023-07-03 NOTE — ED Notes (Signed)
 Pt requesting to have IV out and monitoring removed, says he wants to leave, says he heard someone say they found a bed bug. Dr Carol Chroman made aware.

## 2023-07-03 NOTE — ED Triage Notes (Signed)
 Pt c/o chest pain(tightness) while lying in bed tonight.

## 2023-07-03 NOTE — ED Notes (Signed)
 X-ray at bedside

## 2023-07-03 NOTE — ED Provider Notes (Signed)
 [place] EMERGENCY DEPARTMENT AT [facility] Provider Note   CSN: [id number] Arrival date & time: 07/03/23  0046     History  Chief Complaint  Patient presents with   Chest Pain    Luis Gilbert is a 58 y.o. male.  Patient presents to the emergency department for evaluation of chest pain.  Patient reports that symptoms began a couple of hours ago.  He was sitting at home, not hurting himself at all when symptoms began.  He describes as a tightness across the chest with some mild shortness of breath.       Home Medications Prior to Admission medications   Medication Sig Start Date End Date Taking? Authorizing Provider  amLODipine  (NORVASC ) 10 MG tablet Take 10 mg by mouth daily.    [provider]  budesonide -formoterol  (SYMBICORT ) 80-4.5 MCG/ACT inhaler Take 2 puffs first thing in am and then another 2 puffs about 12 hours later. 01/08/23   Diamond Formica, MD  gabapentin  (NEURONTIN ) 100 MG capsule Take 1 capsule (100 mg total) by mouth 2 (two) times daily. 05/25/23   Kirsteins, Cecilia Coe, MD  nitroGLYCERIN  (NITROSTAT ) 0.4 MG SL tablet as neede for chest pain; may repeat q 5 min x 3 prn; go to ED if still having chest pain 05/12/22   Cook, Jayce G, DO  XARELTO  10 MG TABS tablet TAKE ONE TABLET BY MOUTH ONCE DAILY. 04/26/23   Cook, Jayce G, DO      Allergies    Tomato and Lisinopril    Review of Systems   Review of Systems  Physical Exam Updated Vital Signs BP (!) 135/91   Pulse 85   Temp 97.7 F (36.5 C) (Oral)   Resp 18   Ht 5\' 9"  (1.753 m)   Wt 102 kg   SpO2 94%   BMI 33.21 kg/m  Physical Exam Vitals and nursing note reviewed.  Constitutional:      General: He is not in acute distress.    Appearance: He is well-developed.  HENT:     Head: Normocephalic and atraumatic.     Mouth/Throat:     Mouth: Mucous membranes are moist.  Eyes:     General: Vision grossly intact. Gaze aligned appropriately.     Extraocular Movements: Extraocular  movements intact.     Conjunctiva/sclera: Conjunctivae normal.  Cardiovascular:     Rate and Rhythm: Normal rate and regular rhythm.     Pulses: Normal pulses.     Heart sounds: Normal heart sounds, S1 normal and S2 normal. No murmur heard.    No friction rub. No gallop.  Pulmonary:     Effort: Pulmonary effort is normal. No respiratory distress.     Breath sounds: Normal breath sounds.  Abdominal:     Palpations: Abdomen is soft.     Tenderness: There is no abdominal tenderness. There is no guarding or rebound.     Hernia: No hernia is present.  Musculoskeletal:        General: No swelling.     Cervical back: Full passive range of motion without pain, normal range of motion and neck supple. No pain with movement, spinous process tenderness or muscular tenderness. Normal range of motion.     Right lower leg: No edema.     Left lower leg: No edema.  Skin:    General: Skin is warm and dry.     Capillary Refill: Capillary refill takes less than 2 seconds.     Findings: No ecchymosis,  erythema, lesion or wound.  Neurological:     Mental Status: He is alert and oriented to person, place, and time.     GCS: GCS eye subscore is 4. GCS verbal subscore is 5. GCS motor subscore is 6.     Cranial Nerves: Cranial nerves 2-12 are intact.     Sensory: Sensation is intact.     Motor: Motor function is intact. No weakness or abnormal muscle tone.     Coordination: Coordination is intact.  Psychiatric:        Mood and Affect: Mood normal.        Speech: Speech normal.        Behavior: Behavior normal.     ED Results / Procedures / Treatments   Labs (all labs ordered are listed, but only abnormal results are displayed) Labs Reviewed  CBC WITH DIFFERENTIAL/PLATELET - Abnormal; Notable for the following components:      Result Value   MCH 34.4 (*)    Neutro Abs 1.4 (*)    All other components within normal limits  BASIC METABOLIC PANEL WITH GFR - Abnormal; Notable for the following  components:   Sodium 134 (*)    CO2 21 (*)    Calcium  8.6 (*)    All other components within normal limits  BRAIN NATRIURETIC PEPTIDE  TROPONIN I (HIGH SENSITIVITY)    EKG EKG Interpretation Date/Time:  Tuesday Jul 03 2023 00:56:18 EDT Ventricular Rate:  94 PR Interval:  184 QRS Duration:  98 QT Interval:  368 QTC Calculation: 461 R Axis:   -2  Text Interpretation: Sinus rhythm Abnormal R-wave progression, early transition No significant change since last tracing Confirmed by Ballard Bongo [phone]) on 07/03/2023 1:02:21 AM  Radiology DG Chest Port 1 View Result Date: 07/03/2023 CLINICAL DATA:  Shortness of breath and chest pain EXAM: PORTABLE CHEST 1 VIEW COMPARISON:  04/27/2019, 09/22/2022 FINDINGS: Cardiac shadow is enlarged. Aortic calcifications are seen. The lungs are well aerated bilaterally. There is central vascular congestion is noted without edema. No infiltrate is noted. No bony abnormality is seen. IMPRESSION: Changes of early CHF without significant edema. Electronically Signed   By: Violeta Grey M.D.   On: 07/03/2023 01:35    Procedures Procedures    Medications Ordered in ED Medications - No data to display  ED Course/ Medical Decision Making/ A&P                                 Medical Decision Making Amount and/or Complexity of Data Reviewed Labs: ordered. Radiology: ordered.   Differential Diagnosis considered includes, but not limited to: STEMI; NSTEMI; myocarditis; pericarditis; pulmonary embolism; aortic dissection; pneumothorax; pneumonia; gastritis; musculoskeletal pain  Presents to the emergency department for evaluation of chest pain that started at rest.  He reports slight shortness of breath as well.  EKG without ischemic changes.  Blood work unremarkable including normal first troponin.  After the first troponin, patient decided he did not want to be in the emergency department any longer and asked for discharge.  Suspect that this was  noncardiac chest pain, workup to this point has been reassuring.  Can follow-up with primary care.        Final Clinical Impression(s) / ED Diagnoses Final diagnoses:  Chest pain, unspecified type    Rx / DC Orders ED Discharge Orders     None         Aldan Camey, Marine Sia,  MD 07/03/23 1610

## 2023-07-18 ENCOUNTER — Encounter: Payer: Self-pay | Admitting: Medical

## 2023-07-18 ENCOUNTER — Ambulatory Visit: Admitting: Medical

## 2023-07-18 ENCOUNTER — Ambulatory Visit: Attending: Medical | Admitting: Medical

## 2023-07-18 NOTE — Progress Notes (Deleted)
  Cardiology Office Note:  .   Date:  07/18/2023  ID:  Luis Gilbert, DOB January 05, 1966, MRN [id number] PCP: Cook, Jayce G, DO  [facility] Providers Cardiologist:  None { Click to update primary MD,subspecialty MD or APP then REFRESH:1}   History of Present Illness: .   Luis Gilbert is a 58 y.o. male with a pmh of syncope, h/o PE in 2021, ETOH, aortic atherosclerosis, LLE, HTN, allergic to lisinopril who presents for ER follow-up/pre-op.   He saw Dr. Amanda Jungling in 10/2021 as a new patient. Reported syncope and chronic cough x 2 years than can cause him to pass out. Echo 10/2021 showed LVEF 55-60%, no WMA, mild MVH.  ER visit 07/03/23 for chest pain. Work-up was negative. HS trop and BPN normal.  He has upcoming umbilical hernia repair.  Today   ROS: ***  Studies Reviewed: .        *** Risk Assessment/Calculations:   {Does this patient have ATRIAL FIBRILLATION?:[phone]} No BP recorded.  {Refresh Note OR Click here to enter BP  :1}***       Physical Exam:   VS:  There were no vitals taken for this visit.   Wt Readings from Last 3 Encounters:  07/03/23 224 lb 13.9 oz (102 kg)  05/25/23 224 lb 12.8 oz (102 kg)  03/22/23 228 lb (103.4 kg)    GEN: Well nourished, well developed in no acute distress NECK: No JVD; No carotid bruits CARDIAC: ***RRR, no murmurs, rubs, gallops RESPIRATORY:  Clear to auscultation without rales, wheezing or rhonchi  ABDOMEN: Soft, non-tender, non-distended EXTREMITIES:  No edema; No deformity   ASSESSMENT AND PLAN: .   ***    {Are you ordering a CV Procedure (e.g. stress test, cath, DCCV, TEE, etc)?   Press F2        :[id number]}  Dispo: ***  Signed, Jeniffer Culliver Rebekah Canada, PA-C

## 2023-07-20 ENCOUNTER — Ambulatory Visit (HOSPITAL_COMMUNITY)

## 2023-07-20 ENCOUNTER — Telehealth (HOSPITAL_COMMUNITY): Payer: Self-pay

## 2023-07-20 NOTE — Therapy (Deleted)
 OUTPATIENT PHYSICAL THERAPY THORACOLUMBAR EVALUATION   Patient Name: Luis Gilbert MRN: [id number] DOB:1965-08-30, 58 y.o., male Today's Date: 07/20/2023  END OF SESSION:   Past Medical History:  Diagnosis Date   COPD (chronic obstructive pulmonary disease) (HCC)    Hyperlipidemia    Hypertension    PE (pulmonary thromboembolism) (HCC) 04/2019   Past Surgical History:  Procedure Laterality Date   ARM SURGERY     Patient Active Problem List   Diagnosis Date Noted   History of substance abuse (HCC) 03/14/2023   Umbilical hernia without obstruction and without gangrene 03/14/2023   Asthmatic bronchitis , chronic (HCC) 01/08/2023   Cigarette smoker 01/08/2023   Cough syncope likely related to UACS 11/13/2022   CAD (coronary artery disease) 05/12/2022   Aortic atherosclerosis (HCC) 05/12/2022   Alcohol abuse 05/12/2022   Essential hypertension 11/28/2021   Hyperlipidemia 11/28/2021   Chronic low back pain 11/28/2021   Hepatic steatosis 05/01/2019   Pulmonary embolism (HCC) 04/28/2019    PCP: ***  REFERRING PROVIDER: Genetta Kenning, MD  REFERRING DIAG: [phone] (ICD-10-CM) - Chronic bilateral low back pain with left-sided sciatica  Rationale for Evaluation and Treatment: Rehabilitation  THERAPY DIAG:  No diagnosis found.  ONSET DATE: ***  SUBJECTIVE:                                                                                                                                                                                           SUBJECTIVE STATEMENT: ***  PERTINENT HISTORY:  ***  PAIN:  Are you having pain? {OPRCPAIN:27236}  PRECAUTIONS: None  RED FLAGS: {PT Red Flags:29287}   WEIGHT BEARING RESTRICTIONS: No  FALLS:  Has patient fallen in last 6 months? {fallsyesno:27318}  LIVING ENVIRONMENT: Lives with: {OPRC lives with:25569::"lives with their family"} Lives in: {Lives in:25570} Stairs: {opstairs:27293} Has following equipment at  home: {Assistive devices:23999}  OCCUPATION: ***  PLOF: {PLOF:24004}  PATIENT GOALS: ***  NEXT MD VISIT: ***  OBJECTIVE:  Note: Objective measures were completed at Evaluation unless otherwise noted.  DIAGNOSTIC FINDINGS:  IMPRESSION: The patient has significant epidural lipomatosis in the lower lumbar spine. This is contributing to spinal stenosis at L3-4, L4-5, and L5-S1   There is marked atrophy of left paraspinous muscle most likely the longissimus muscle. No evidence of prior surgery. Correlate with prior injury.  PATIENT SURVEYS:  Modified Oswestry ***   COGNITION: Overall cognitive status: {cognition:24006}     SENSATION: {sensation:27233}  MUSCLE LENGTH: Hamstrings: Right *** deg; Left *** deg Andy Bannister test: Right *** deg; Left *** deg  POSTURE: {posture:25561}  PALPATION: ***  LUMBAR ROM:   AROM eval  Flexion  Extension   Right lateral flexion   Left lateral flexion   Right rotation   Left rotation    (Blank rows = not tested)  LOWER EXTREMITY ROM:     {AROM/PROM:27142}  Right eval Left eval  Hip flexion    Hip extension    Hip abduction    Hip adduction    Hip internal rotation    Hip external rotation    Knee flexion    Knee extension    Ankle dorsiflexion    Ankle plantarflexion    Ankle inversion    Ankle eversion     (Blank rows = not tested)  LOWER EXTREMITY MMT:    MMT Right eval Left eval  Hip flexion    Hip extension    Hip abduction    Hip adduction    Hip internal rotation    Hip external rotation    Knee flexion    Knee extension    Ankle dorsiflexion    Ankle plantarflexion    Ankle inversion    Ankle eversion     (Blank rows = not tested)  LUMBAR SPECIAL TESTS:  {lumbar special test:25242}  FUNCTIONAL TESTS:  30 seconds chair stand test 2 minute walk test: ***  GAIT: Distance walked: *** Assistive device utilized: {Assistive devices:23999} Level of assistance: {Levels of  assistance:24026} Comments: ***  TREATMENT DATE:  07/20/23: PT Eval and HEP                                                                                                                                 PATIENT EDUCATION:  Education details: PT evaluation, objective findings, POC, Importance of HEP, Precautions, Clinic policies  Person educated: Patient Education method: Explanation and Demonstration Education comprehension: verbalized understanding and returned demonstration  HOME EXERCISE PROGRAM: ***  ASSESSMENT:  CLINICAL IMPRESSION: Patient is a 58 y.o. male who was seen today for physical therapy evaluation and treatment for M54.42,G89.29 (ICD-10-CM) - Chronic bilateral low back pain with left-sided sciatica .   OBJECTIVE IMPAIRMENTS: {opptimpairments:25111}.   ACTIVITY LIMITATIONS: {activitylimitations:27494}  PARTICIPATION LIMITATIONS: {participationrestrictions:25113}  PERSONAL FACTORS: {Personal factors:25162} are also affecting patient's functional outcome.   REHAB POTENTIAL: {rehabpotential:25112}  CLINICAL DECISION MAKING: {clinical decision making:25114}  EVALUATION COMPLEXITY: {Evaluation complexity:25115}   GOALS: Goals reviewed with patient? No  SHORT TERM GOALS: Target date: 08/03/23 Patient will be independent with performance of HEP to demonstrate adequate self management of symptoms.  Baseline:  Goal status: INITIAL  2.   Patient will report at least a 25% improvement with function or pain overall since beginning PT. Baseline:  Goal status: INITIAL  LONG TERM GOALS: Target date: 08/31/23 Patient will improve Oswestry score by     points in order to improve self-perceived disability and overall function.  Baseline: Goal status: INITIAL   2.  Patient will improve     score by     in order to   .  Baseline:  Goal status: INITIAL   3.  Patient will improve    test to   in order to improve LE strength and endurance to return to  . Baseline:   Goal status: INITIAL   4.  Patient will gain at least    deg of AROM in     in order to improve foot clearance for safe gait mechanics Baseline:  Goal status: INITIAL   5.  Patient will report overall 50% improvement since beginning PT. Baseline:  Goal status: INITIAL   PLAN:  PT FREQUENCY: 2x/week  PT DURATION: 6 weeks  PLANNED INTERVENTIONS: 97164- PT Re-evaluation, 97110-Therapeutic exercises, 97530- Therapeutic activity, [id number]- Neuromuscular re-education, 97535- Self Care, 16109- Manual therapy, [id number]- Gait training, [phone]- Electrical stimulation (manual), [phone]- Traction (mechanical), Patient/Family education, Balance training, Stair training, Taping, Dry Needling, Joint mobilization, Spinal mobilization, and Moist heat.  PLAN FOR NEXT SESSION: ***   2:48 PM, 07/20/23 Marysue Sola, PT, DPT [facility] Auth Request  Referring diagnosis code (ICD 10)? M54.42,G89.29 (ICD-10-CM) - Chronic bilateral low back pain with left-sided sciatica Treatment diagnosis codes (ICD 10)? (if different than referring diagnosis) *** What was this (referring dx) caused by? []  Surgery []  Fall [x]  Ongoing issue []  Arthritis []  Other: ____________  Laterality: [x]  Rt []  Lt []  Both  Deficits: []  Pain []  Stiffness []  Weakness []  Edema []  Balance Deficits []  Coordination []  Gait Disturbance []  ROM []  Other   Functional Tool Score: ***  CPT codes: See Planned Interventions listed in the Plan section of the Evaluation.

## 2023-07-20 NOTE — Telephone Encounter (Signed)
 Called patient regarding missed evaluation on this date. Spoke with patient who preferred I speak with wife. Wife states patient is having a lot of pain and was unsure if patient would even participate in Physical Therapy. Gave clinic's front desk number to wife to call and reschedule for evaluation when ready.    3:56 PM, 07/20/23 Luis Gilbert, PT, DPT [facility] [place]

## 2023-08-27 DIAGNOSIS — R5383 Other fatigue: Secondary | ICD-10-CM | POA: Diagnosis not present

## 2023-08-27 DIAGNOSIS — M549 Dorsalgia, unspecified: Secondary | ICD-10-CM | POA: Diagnosis not present

## 2023-08-27 DIAGNOSIS — Z79899 Other long term (current) drug therapy: Secondary | ICD-10-CM | POA: Diagnosis not present

## 2023-08-27 DIAGNOSIS — D539 Nutritional anemia, unspecified: Secondary | ICD-10-CM | POA: Diagnosis not present

## 2023-08-27 DIAGNOSIS — E559 Vitamin D deficiency, unspecified: Secondary | ICD-10-CM | POA: Diagnosis not present

## 2023-08-27 DIAGNOSIS — R0602 Shortness of breath: Secondary | ICD-10-CM | POA: Diagnosis not present

## 2023-08-27 DIAGNOSIS — M129 Arthropathy, unspecified: Secondary | ICD-10-CM | POA: Diagnosis not present

## 2023-08-27 DIAGNOSIS — Z1159 Encounter for screening for other viral diseases: Secondary | ICD-10-CM | POA: Diagnosis not present

## 2023-09-10 ENCOUNTER — Ambulatory Visit: Payer: Medicaid Other | Admitting: Family Medicine

## 2023-09-11 ENCOUNTER — Encounter: Payer: Self-pay | Admitting: Family Medicine

## 2023-11-11 ENCOUNTER — Other Ambulatory Visit: Payer: Self-pay | Admitting: Family Medicine

## 2023-11-13 ENCOUNTER — Ambulatory Visit (INDEPENDENT_AMBULATORY_CARE_PROVIDER_SITE_OTHER): Admitting: Family Medicine

## 2023-11-13 VITALS — BP 138/84 | HR 50 | Ht 69.0 in | Wt 211.0 lb

## 2023-11-13 DIAGNOSIS — R7303 Prediabetes: Secondary | ICD-10-CM | POA: Diagnosis not present

## 2023-11-13 DIAGNOSIS — Z7901 Long term (current) use of anticoagulants: Secondary | ICD-10-CM

## 2023-11-13 DIAGNOSIS — Z125 Encounter for screening for malignant neoplasm of prostate: Secondary | ICD-10-CM

## 2023-11-13 DIAGNOSIS — I10 Essential (primary) hypertension: Secondary | ICD-10-CM | POA: Diagnosis not present

## 2023-11-13 DIAGNOSIS — Z86711 Personal history of pulmonary embolism: Secondary | ICD-10-CM

## 2023-11-13 DIAGNOSIS — E782 Mixed hyperlipidemia: Secondary | ICD-10-CM

## 2023-11-13 DIAGNOSIS — J4489 Other specified chronic obstructive pulmonary disease: Secondary | ICD-10-CM

## 2023-11-13 MED ORDER — AMLODIPINE BESYLATE 10 MG PO TABS: 10.0000 mg | ORAL_TABLET | Freq: Every day | ORAL | 3 refills | Status: AC

## 2023-11-13 MED ORDER — RIVAROXABAN 10 MG PO TABS: 10.0000 mg | ORAL_TABLET | Freq: Every day | ORAL | 3 refills | Status: AC

## 2023-11-13 NOTE — Patient Instructions (Signed)
 Labs ordered   Follow up in 6 months

## 2023-11-14 DIAGNOSIS — Z86711 Personal history of pulmonary embolism: Secondary | ICD-10-CM | POA: Insufficient documentation

## 2023-11-14 NOTE — Assessment & Plan Note (Signed)
 Stable currently.  Advised to quit smoking.  Patient not interested in quitting.

## 2023-11-14 NOTE — Assessment & Plan Note (Signed)
 Has been fairly well-controlled.  Last LDL 72.  Lipid panel today.

## 2023-11-14 NOTE — Assessment & Plan Note (Signed)
Stable. °-Continue amlodipine °

## 2023-11-14 NOTE — Assessment & Plan Note (Signed)
 Lifelong anticoagulation.  Xarelto  refilled.

## 2023-11-14 NOTE — Progress Notes (Signed)
 Subjective:  Patient ID: Luis Gilbert, male    DOB: 02/23/1966  Age: 58 y.o. MRN: [id number]  CC:   Chief Complaint  Patient presents with   Medical Management of Chronic Issues    HPI:  58 year old male presents for routine follow-up.  Patient states that overall he is doing well.  He has no complaints or concerns at this time.  Blood pressure is well-controlled on amlodipine .  Continues to smoke.  He is not interested in smoking cessation.  Breathing stable.  Patient declines vaccines today.  He declines colonoscopy.  Patient Active Problem List   Diagnosis Date Noted   History of pulmonary embolism 11/14/2023   History of substance abuse (HCC) 03/14/2023   Umbilical hernia without obstruction and without gangrene 03/14/2023   Asthmatic bronchitis , chronic (HCC) 01/08/2023   Cigarette smoker 01/08/2023   Cough syncope likely related to UACS 11/13/2022   CAD (coronary artery disease) 05/12/2022   Aortic atherosclerosis (HCC) 05/12/2022   Alcohol abuse 05/12/2022   Essential hypertension 11/28/2021   Hyperlipidemia 11/28/2021   Chronic low back pain 11/28/2021   Hepatic steatosis 05/01/2019    Social Hx   Social History   Socioeconomic History   Marital status: Married    Spouse name: Not on file   Number of children: Not on file   Years of education: Not on file   Highest education level: Not on file  Occupational History   Not on file  Tobacco Use   Smoking status: Every Day    Current packs/day: 1.00    Average packs/day: 1 pack/day for 20.0 years (20.0 ttl pk-yrs)    Types: Cigarettes   Smokeless tobacco: Never  Vaping Use   Vaping status: Never Used  Substance and Sexual Activity   Alcohol use: Yes    Comment: drinks 1- 40 oz daily   Drug use: Yes    Types: Marijuana    Comment: MJ qod.  no cocaine since 04/2019   Sexual activity: Yes  Other Topics Concern   Not on file  Social History Narrative   Not on file   Social Drivers of Health    Financial Resource Strain: Not on file  Food Insecurity: Not on file  Transportation Needs: Not on file  Physical Activity: Not on file  Stress: Not on file  Social Connections: Not on file    Review of Systems Per HPI  Objective:  BP 138/84   Pulse (!) 50   Ht 5' 9 (1.753 m)   Wt 211 lb (95.7 kg)   SpO2 99%   BMI 31.16 kg/m      11/13/2023    2:33 PM 07/03/2023    1:30 AM 07/03/2023    1:00 AM  BP/Weight  Systolic BP 138 135 138  Diastolic BP 84 91 87  Wt. (Lbs) 211    BMI 31.16 kg/m2      Physical Exam Vitals and nursing note reviewed.  Constitutional:      General: He is not in acute distress.    Appearance: Normal appearance.  HENT:     Head: Normocephalic and atraumatic.  Eyes:     General:        Right eye: No discharge.        Left eye: No discharge.     Conjunctiva/sclera: Conjunctivae normal.  Cardiovascular:     Rate and Rhythm: Normal rate and regular rhythm.  Pulmonary:     Effort: Pulmonary effort is normal.  Breath sounds: Wheezing present.  Neurological:     Mental Status: He is alert.  Psychiatric:        Mood and Affect: Mood normal.        Behavior: Behavior normal.     Lab Results  Component Value Date   WBC 4.6 07/03/2023   HGB 15.3 07/03/2023   HCT 43.3 07/03/2023   PLT 195 07/03/2023   GLUCOSE 98 07/03/2023   CHOL 150 11/13/2022   TRIG 79 11/13/2022   HDL 63 11/13/2022   LDLCALC 72 11/13/2022   ALT 114 (H) 11/13/2022   AST 94 (H) 11/13/2022   NA 134 (L) 07/03/2023   K 3.9 07/03/2023   CL 100 07/03/2023   CREATININE 1.03 07/03/2023   BUN 10 07/03/2023   CO2 21 (L) 07/03/2023   INR 1.0 04/28/2019   HGBA1C 5.8 (H) 11/13/2022     Assessment & Plan:  Essential hypertension Assessment & Plan: Stable.  Continue amlodipine .  Orders: -     amLODIPine  Besylate; Take 1 tablet (10 mg total) by mouth daily.  Dispense: 90 tablet; Refill: 3  Mixed hyperlipidemia Assessment & Plan: Has been fairly well-controlled.   Last LDL 72.  Lipid panel today.  Orders: -     CMP14+EGFR -     Lipid panel  Chronic anticoagulation -     CBC  Prediabetes -     Hemoglobin A1c  Prostate cancer screening -     PSA  History of pulmonary embolism Assessment & Plan: Lifelong anticoagulation.  Xarelto  refilled.  Orders: -     Rivaroxaban ; Take 1 tablet (10 mg total) by mouth daily.  Dispense: 90 tablet; Refill: 3  Asthmatic bronchitis , chronic (HCC) Assessment & Plan: Stable currently.  Advised to quit smoking.  Patient not interested in quitting.     Follow-up: 6 months  Vandy Tsuchiya Bluford DO [facility]

## 2024-01-19 ENCOUNTER — Other Ambulatory Visit: Payer: Self-pay | Admitting: Family Medicine

## 2024-02-29 ENCOUNTER — Telehealth: Payer: Self-pay

## 2024-02-29 NOTE — Telephone Encounter (Signed)
 Copied from CRM [id number]. Topic: Medical Record Request - Records Request >> Feb 29, 2024  2:10 PM Ivette P wrote: Reason for CRM:  let Dr. Bluford know that should be getting an email form [employer] regarding U card to be able to pay for utilities

## 2024-05-01 ENCOUNTER — Ambulatory Visit: Admitting: Family Medicine

## 2024-05-12 ENCOUNTER — Ambulatory Visit: Admitting: Family Medicine
